# Patient Record
Sex: Male | Born: 1940 | Race: White | Hispanic: No | Smoking: Former smoker
Health system: Southern US, Community
[De-identification: ages and names within clinical notes are randomized; demographics above are authoritative.]

## PROBLEM LIST (undated history)

## (undated) DIAGNOSIS — M1990 Unspecified osteoarthritis, unspecified site: Secondary | ICD-10-CM

## (undated) DIAGNOSIS — E7800 Pure hypercholesterolemia, unspecified: Secondary | ICD-10-CM

## (undated) DIAGNOSIS — H9319 Tinnitus, unspecified ear: Secondary | ICD-10-CM

## (undated) HISTORY — PX: OTHER SURGICAL HISTORY: SHX169

## (undated) HISTORY — PX: TONSILLECTOMY: SUR1361

## (undated) HISTORY — DX: Unspecified osteoarthritis, unspecified site: M19.90

## (undated) HISTORY — DX: Pure hypercholesterolemia, unspecified: E78.00

---

## 1979-06-27 HISTORY — PX: HERNIA REPAIR: SHX51

## 1979-06-27 HISTORY — PX: OTHER SURGICAL HISTORY: SHX169

## 2010-02-05 ENCOUNTER — Encounter: Admission: RE | Admit: 2010-02-05 | Discharge: 2010-02-05 | Payer: Self-pay | Admitting: Cardiology

## 2010-08-26 HISTORY — PX: OTHER SURGICAL HISTORY: SHX169

## 2011-02-06 ENCOUNTER — Other Ambulatory Visit: Payer: Self-pay | Admitting: *Deleted

## 2011-02-06 DIAGNOSIS — Z79899 Other long term (current) drug therapy: Secondary | ICD-10-CM

## 2011-02-06 DIAGNOSIS — E7800 Pure hypercholesterolemia, unspecified: Secondary | ICD-10-CM

## 2011-02-09 ENCOUNTER — Other Ambulatory Visit (INDEPENDENT_AMBULATORY_CARE_PROVIDER_SITE_OTHER): Payer: 59 | Admitting: *Deleted

## 2011-02-09 DIAGNOSIS — E7800 Pure hypercholesterolemia, unspecified: Secondary | ICD-10-CM

## 2011-02-09 DIAGNOSIS — Z79899 Other long term (current) drug therapy: Secondary | ICD-10-CM

## 2011-02-09 LAB — BASIC METABOLIC PANEL
CO2: 28 mEq/L (ref 19–32)
Chloride: 100 mEq/L (ref 96–112)
GFR: 77.88 mL/min (ref >60.00)
Sodium: 135 mEq/L (ref 135–145)

## 2011-02-09 LAB — HEPATIC FUNCTION PANEL
ALT: 21 U/L (ref 0–53)
Albumin: 4.2 g/dL (ref 3.5–5.2)

## 2011-02-09 LAB — CBC WITH DIFFERENTIAL/PLATELET
Basophils Relative: 0.6 % (ref 0.0–3.0)
HCT: 46.9 % (ref 39.0–52.0)
MCV: 91.1 fl (ref 78.0–100.0)
Neutrophils Relative %: 59.8 % (ref 43.0–77.0)
WBC: 7 10*3/uL (ref 4.5–10.5)

## 2011-02-09 LAB — LIPID PANEL
Cholesterol: 136 mg/dL (ref 0–200)
HDL: 47 mg/dL (ref >39.00)
LDL Cholesterol: 74 mg/dL (ref 0–99)
Total CHOL/HDL Ratio: 3
VLDL: 15.2 mg/dL (ref 0.0–40.0)

## 2011-02-10 ENCOUNTER — Encounter: Payer: Self-pay | Admitting: Cardiology

## 2011-02-10 DIAGNOSIS — M1990 Unspecified osteoarthritis, unspecified site: Secondary | ICD-10-CM | POA: Insufficient documentation

## 2011-02-10 DIAGNOSIS — E7800 Pure hypercholesterolemia, unspecified: Secondary | ICD-10-CM | POA: Insufficient documentation

## 2011-02-11 ENCOUNTER — Ambulatory Visit (INDEPENDENT_AMBULATORY_CARE_PROVIDER_SITE_OTHER): Payer: 59 | Admitting: Cardiology

## 2011-02-11 ENCOUNTER — Ambulatory Visit
Admission: RE | Admit: 2011-02-11 | Discharge: 2011-02-11 | Disposition: A | Discharge status: Home / Self Care | Payer: 59 | Source: Ambulatory Visit | Attending: Cardiology | Admitting: Cardiology

## 2011-02-11 ENCOUNTER — Encounter: Payer: Self-pay | Admitting: Cardiology

## 2011-02-11 VITALS — BP 120/70 | HR 76 | Wt 196.0 lb

## 2011-02-11 DIAGNOSIS — M1990 Unspecified osteoarthritis, unspecified site: Secondary | ICD-10-CM

## 2011-02-11 DIAGNOSIS — E7800 Pure hypercholesterolemia, unspecified: Secondary | ICD-10-CM

## 2011-02-11 NOTE — Assessment & Plan Note (Signed)
This patient has had a history of hypercholesterolemia he previously was on Crestor and this was changed several years ago to simvastatin 40 mg generic.  He's not having any side effects from simvastatin.  He is watching his diet better and his weight is down 5 pounds.  He has not been expressing any chest pain or shortness of breath.

## 2011-02-11 NOTE — Assessment & Plan Note (Signed)
The patient does have some osteoarthritis.  Since we last saw him he underwent successful arthroscopic surgery on his right shoulder by Dr. Lajoyce Corners.  He had good postoperative physical therapy and has had a good functional outcome.  Patient is also been experiencing some back pain.  For now he had to stop his regular walking and stopped doing his abdominal crunches but cause of his back pain but now he is back to full activity again.  He has Aleve on hand for p.r.n. Use.

## 2011-02-11 NOTE — Progress Notes (Signed)
Matthew Collins Date of Birth:  December 27, 1940 [facility] [address]   [address] [place], [place]  [address] [phone]           Fax   [phone]  History of Present Illness:  this pleasant 70 year old gentleman is seen for one year comprehensive medical evaluation.  He has remained in good health.  He does have some arthritic problems in his back and previously in his right shoulder.  Since we last saw him he has had successful arthroscopic surgery on his right shoulder the patient is not having any exertional chest pain to suggest angina.  He's had no symptoms of congestive heart failure.  He's had no change in bowel habits and he did have a colonoscopy which was normal.  He has been allowed to go 10 years between colonoscopies.  He's had no dizziness or syncope.  Z. No respiratory complaints or cough or sputum production.  He does have nocturia x1.  He has had some erectile dysfunction in the past and at one point had tried some Cialis.  Current Outpatient Prescriptions  Medication Sig Dispense Refill  . ALPRAZolam (XANAX) 0.25 MG tablet Take 0.25 mg by mouth at bedtime as needed.        Marland Kitchen aspirin 81 MG tablet Take 81 mg by mouth daily.        . Naproxen Sodium (ALEVE PO) Take by mouth as needed.        . simvastatin (ZOCOR) 40 MG tablet Take 40 mg by mouth at bedtime.          No Known Allergies  Patient Active Problem List  Diagnoses  . Hypercholesterolemia  . Osteoarthritis    History  Smoking status  . Former Smoker  . Quit date: 02/10/1991  Smokeless tobacco  . Not on file    History  Alcohol Use No    Family History  Problem Relation Age of Onset  . Heart disease Mother 10  . Heart failure Mother 42  . Heart failure Father 64    Review of Systems: Constitutional: no fever chills diaphoresis or fatigue or change in weight.  Head and neck: no hearing loss, no epistaxis, no photophobia or visual disturbance. Respiratory: No  cough, shortness of breath or wheezing. Cardiovascular: No chest pain peripheral edema, palpitations. Gastrointestinal: No abdominal distention, no abdominal pain, no change in bowel habits hematochezia or melena. Genitourinary: No dysuria, no frequency, no urgency, no nocturia. Musculoskeletal:No arthralgias, no back pain, no gait disturbance or myalgias. Neurological: No dizziness, no headaches, no numbness, no seizures, no syncope, no weakness, no tremors. Hematologic: No lymphadenopathy, no easy bruising. Psychiatric: No confusion, no hallucinations, no sleep disturbance.    Physical Exam: Filed Vitals:   02/11/11 1420  BP: 120/70  Pulse: 76    Weight 196, down 5 pounds.  The general appearance reveals a well-developed well-nourished gentleman in no distress.Pupils equal and reactive.   Extraocular Movements are full.  There is no scleral icterus.  The mouth and pharynx are normal.  The neck is supple.  The carotids reveal no bruits.  The jugular venous pressure is normal.  The thyroid is not enlarged.  There is no lymphadenopathy.The chest is clear to percussion and auscultation. There are no rales or rhonchi. Expansion of the chest is symmetrical.The precordium is quiet.  The first heart sound is normal.  The second heart sound is physiologically split.  There is no murmur gallop rub or click.  There is no  abnormal lift or heave.The abdomen is soft and nontender. Bowel sounds are normal. The liver and spleen are not enlarged. There Are no abdominal masses. There are no bruits. Rectal exam reveals mildly enlarged prostate without hard nodules.  Stool is brown and benzidine negative .  No rectal mass.The pedal pulses are good.  There is no phlebitis or edema.  There is no cyanosis or clubbing.Strength is normal and symmetrical in all extremities.  There is no lateralizing weakness.  There are no sensory deficits.The skin is warm and dry.  There is no rash.   Assessment / Plan:   We reviewed  his labs which are excellent.  His electrocardiogram is normal, and a chest x-ray was ordered.  No change in regimen and may return in one year for followup comprehensive medical exam.

## 2011-02-12 ENCOUNTER — Telehealth: Payer: Self-pay | Admitting: *Deleted

## 2011-02-12 NOTE — Telephone Encounter (Signed)
Message copied by Regis Bill on Thu Feb 12, 2011 11:00 AM ------      Message from: Cassell Clement      Created: Wed Feb 11, 2011  9:46 PM       Pls report.  Xray nl.

## 2011-02-12 NOTE — Telephone Encounter (Signed)
Advised patient of chest xray 

## 2011-02-12 NOTE — Progress Notes (Signed)
Advise patient: 

## 2011-07-23 ENCOUNTER — Other Ambulatory Visit: Payer: Self-pay | Admitting: Cardiology

## 2011-07-23 ENCOUNTER — Other Ambulatory Visit: Payer: Self-pay | Admitting: *Deleted

## 2011-07-23 DIAGNOSIS — F419 Anxiety disorder, unspecified: Secondary | ICD-10-CM

## 2011-07-23 MED ORDER — ALPRAZOLAM 0.25 MG PO TABS: 0.2500 mg | ORAL_TABLET | Freq: Every evening | ORAL | Status: DC | PRN

## 2011-07-23 NOTE — Telephone Encounter (Signed)
Pt wants refill of xanax called to [facility]

## 2011-07-23 NOTE — Telephone Encounter (Signed)
Refilled xanax to [facility]

## 2011-08-27 HISTORY — PX: OTHER SURGICAL HISTORY: SHX169

## 2011-10-27 HISTORY — PX: OTHER SURGICAL HISTORY: SHX169

## 2011-12-17 ENCOUNTER — Other Ambulatory Visit: Payer: Self-pay | Admitting: *Deleted

## 2011-12-17 DIAGNOSIS — F419 Anxiety disorder, unspecified: Secondary | ICD-10-CM

## 2011-12-17 MED ORDER — ALPRAZOLAM 0.25 MG PO TABS: 0.2500 mg | ORAL_TABLET | Freq: Every evening | ORAL | Status: DC | PRN

## 2011-12-17 NOTE — Telephone Encounter (Signed)
Refill on alprazolam 

## 2012-01-29 ENCOUNTER — Other Ambulatory Visit (HOSPITAL_COMMUNITY): Payer: Self-pay | Admitting: Orthopaedic Surgery

## 2012-02-09 ENCOUNTER — Ambulatory Visit (INDEPENDENT_AMBULATORY_CARE_PROVIDER_SITE_OTHER): Payer: 59 | Admitting: *Deleted

## 2012-02-09 DIAGNOSIS — Z125 Encounter for screening for malignant neoplasm of prostate: Secondary | ICD-10-CM

## 2012-02-09 DIAGNOSIS — Z139 Encounter for screening, unspecified: Secondary | ICD-10-CM

## 2012-02-09 DIAGNOSIS — E7800 Pure hypercholesterolemia, unspecified: Secondary | ICD-10-CM

## 2012-02-09 LAB — LIPID PANEL
HDL: 48.1 mg/dL (ref >39.00)
LDL Cholesterol: 74 mg/dL (ref 0–99)
Total CHOL/HDL Ratio: 3
Triglycerides: 54 mg/dL (ref 0.0–149.0)

## 2012-02-09 LAB — BASIC METABOLIC PANEL
Calcium: 8.6 mg/dL (ref 8.4–10.5)
GFR: 71.88 mL/min (ref >60.00)
Glucose, Bld: 84 mg/dL (ref 70–99)
Sodium: 135 mEq/L (ref 135–145)

## 2012-02-09 LAB — HEPATIC FUNCTION PANEL
ALT: 22 U/L (ref 0–53)
Albumin: 4 g/dL (ref 3.5–5.2)
Alkaline Phosphatase: 48 U/L (ref 39–117)
Total Protein: 6.1 g/dL (ref 6.0–8.3)

## 2012-02-09 LAB — PSA: PSA: 0.72 ng/mL (ref 0.10–4.00)

## 2012-02-10 ENCOUNTER — Encounter: Payer: Self-pay | Admitting: *Deleted

## 2012-02-11 ENCOUNTER — Ambulatory Visit (INDEPENDENT_AMBULATORY_CARE_PROVIDER_SITE_OTHER): Payer: 59 | Admitting: Cardiology

## 2012-02-11 ENCOUNTER — Encounter: Payer: Self-pay | Admitting: Cardiology

## 2012-02-11 VITALS — BP 118/80 | HR 72 | Ht 73.0 in | Wt 183.0 lb

## 2012-02-11 DIAGNOSIS — E7800 Pure hypercholesterolemia, unspecified: Secondary | ICD-10-CM

## 2012-02-11 DIAGNOSIS — M1990 Unspecified osteoarthritis, unspecified site: Secondary | ICD-10-CM

## 2012-02-11 NOTE — Assessment & Plan Note (Signed)
The patient has not been able to walk effectively because of his left hip pain.  Also he had a long postoperative course after his foot surgery in November 2012.  Despite the lack of exercise his weight is actually down 13 pounds which she attributes to the stress of recovery from his foot surgery.

## 2012-02-11 NOTE — Patient Instructions (Signed)
Your physician recommends that you continue on your current medications as directed. Please refer to the Current Medication list given to you today.  Your physician wants you to follow-up in: 1 year. You will receive a reminder letter in the mail two months in advance. If you don't receive a letter, please call our office to schedule the follow-up appointment.  

## 2012-02-11 NOTE — Progress Notes (Signed)
Matthew Collins Date of Birth:  January 18, 1941 [facility] [address] [place], [place]  [address] [phone]         Fax   [phone]  History of Present Illness: This pleasant 71 year old gentleman is seen for a one-year followup office visit.  He has a history of hypercholesterolemia and is on the statin 40 mg daily.  He also has a past history of osteoarthritis.  He has had previous right shoulder surgery and right foot surgery.  In June he will having a left total hip replacement by Dr. Rayburn Ma.  Patient has a strong family history of ischemic heart disease.  He himself however has not been experiencing any chest pain or shortness of breath.  His blood pressure has been normal and he is not diabetic.  Current Outpatient Prescriptions  Medication Sig Dispense Refill  . ALPRAZolam (XANAX) 0.25 MG tablet Take 1 tablet (0.25 mg total) by mouth at bedtime as needed.  30 tablet  5  . aspirin 81 MG tablet Take 81 mg by mouth daily.        Marland Kitchen HYDROcodone-acetaminophen (NORCO) 5-325 MG per tablet Take 1 tablet by mouth as needed. Taking 1/2 as needed      . Naproxen Sodium (ALEVE PO) Take by mouth as needed.        . simvastatin (ZOCOR) 40 MG tablet Take 40 mg by mouth at bedtime.          No Known Allergies  Patient Active Problem List  Diagnoses  . Hypercholesterolemia  . Osteoarthritis    History  Smoking status  . Former Smoker  . Quit date: 02/10/1991  Smokeless tobacco  . Not on file    History  Alcohol Use No    Family History  Problem Relation Age of Onset  . Heart disease Mother 59  . Heart failure Mother 89  . Heart failure Father 35    Review of Systems: Constitutional: no fever chills diaphoresis or fatigue or change in weight.  Head and neck: no hearing loss, no epistaxis, no photophobia or visual disturbance. Respiratory: No cough, shortness of breath or wheezing. Cardiovascular: No chest pain peripheral edema,  palpitations. Gastrointestinal: No abdominal distention, no abdominal pain, no change in bowel habits hematochezia or melena. Genitourinary: No dysuria, no frequency, no urgency, no nocturia. Musculoskeletal:No arthralgias, no back pain, no gait disturbance or myalgias. Neurological: No dizziness, no headaches, no numbness, no seizures, no syncope, no weakness, no tremors. Hematologic: No lymphadenopathy, no easy bruising. Psychiatric: No confusion, no hallucinations, no sleep disturbance.    Physical Exam: Filed Vitals:   02/11/12 1100  BP: 118/80  Pulse: 72   the general appearance reveals a well-developed well-nourished gentleman in no distress.The head and neck exam reveals pupils equal and reactive.  Extraocular movements are full.  There is no scleral icterus.  The mouth and pharynx are normal.  The neck is supple.  The carotids reveal no bruits.  The jugular venous pressure is normal.  The  thyroid is not enlarged.  There is no lymphadenopathy.  The chest is clear to percussion and auscultation.  There are no rales or rhonchi.  Expansion of the chest is symmetrical.  The precordium is quiet.  The first heart sound is normal.  The second heart sound is physiologically split.  There is no murmur gallop rub or click.  There is no abnormal lift or heave.  The abdomen is soft and nontender.  The bowel sounds are normal.  The  liver and spleen are not enlarged.  There are no abdominal masses.  There are no abdominal bruits.  Extremities reveal good pedal pulses.  There is no phlebitis or edema.  There is no cyanosis or clubbing.  Strength is normal and symmetrical in all extremities.  There is no lateralizing weakness.  There are no sensory deficits.  The skin is warm and dry.  There is no rash.  EKG today shows normal sinus rhythm and is within normal limits   Assessment / Plan: The patient continues to do well.  He will continue on his same dose of simvastatin for control of  hypercholesterolemia.  No new medications are prescribed today.  From a cardiovascular standpoint the patient is stable for proposed hip surgery in June.  The patient will be seeing Dr. Creola Corn for ongoing medical care and we will plan to see the patient in one year for followup office visit, EKG and fasting lipid panel and chemistries.

## 2012-02-11 NOTE — Assessment & Plan Note (Signed)
The patient continues to do well from a cardiovascular standpoint.  No evidence of any atherosclerotic complications at this point.  We reviewed his recent lab work which is satisfactory.  Is not having any side effects from the simvastatin.

## 2012-02-18 ENCOUNTER — Other Ambulatory Visit: Payer: Self-pay | Admitting: *Deleted

## 2012-02-18 MED ORDER — SIMVASTATIN 40 MG PO TABS: 40.0000 mg | ORAL_TABLET | Freq: Every day | ORAL | Status: DC

## 2012-02-18 NOTE — Telephone Encounter (Signed)
Refilled simvastatin.

## 2012-03-25 ENCOUNTER — Encounter (HOSPITAL_COMMUNITY): Payer: Self-pay | Admitting: Pharmacy Technician

## 2012-03-29 ENCOUNTER — Encounter (HOSPITAL_COMMUNITY)
Admission: RE | Admit: 2012-03-29 | Discharge: 2012-03-29 | Disposition: A | Discharge status: Home / Self Care | Payer: Medicare Other | Source: Ambulatory Visit | Attending: Orthopaedic Surgery | Admitting: Orthopaedic Surgery

## 2012-03-29 ENCOUNTER — Encounter (HOSPITAL_COMMUNITY): Payer: Self-pay

## 2012-03-29 HISTORY — DX: Tinnitus, unspecified ear: H93.19

## 2012-03-29 LAB — BASIC METABOLIC PANEL
BUN: 19 mg/dL (ref 6–23)
Calcium: 9 mg/dL (ref 8.4–10.5)
GFR calc Af Amer: 85 mL/min — ABNORMAL LOW (ref >90)
GFR calc non Af Amer: 73 mL/min — ABNORMAL LOW (ref >90)
Glucose, Bld: 90 mg/dL (ref 70–99)

## 2012-03-29 LAB — URINALYSIS, ROUTINE W REFLEX MICROSCOPIC
Ketones, ur: NEGATIVE mg/dL
Leukocytes, UA: NEGATIVE
Nitrite: NEGATIVE
Protein, ur: NEGATIVE mg/dL
Urobilinogen, UA: 0.2 mg/dL (ref 0.0–1.0)

## 2012-03-29 LAB — CBC
MCH: 31.4 pg (ref 26.0–34.0)
MCHC: 35 g/dL (ref 30.0–36.0)
Platelets: 243 10*3/uL (ref 150–400)
RDW: 12.2 % (ref 11.5–15.5)

## 2012-03-29 NOTE — Pre-Procedure Instructions (Signed)
Last office visit note dr Patty Sermons 02-11-2012 epic ekg 02-11-2012 epic

## 2012-03-29 NOTE — Patient Instructions (Addendum)
20 Matthew Collins    Your procedure is scheduled on:  04-01-2012  Report to [facility] at  0530 AM.  Call this number if you have problems the morning of surgery: [phone]   Remember:   Do not eat food or drink liquids:After Midnight.  .  Take these medicines the morning of surgery with A SIP OF WATER: eyedrop, hydrocodone if needed   Do not wear jewelry or make up.  Do not wear lotions, powders, or perfumes.Do not wear deodorant.    Do not bring valuables to the hospital.  Contacts, dentures or bridgework may not be worn into surgery.  Leave suitcase in the car. After surgery it may be brought to your room.  For patients admitted to the hospital, checkout time is 11:00 AM the day of discharge.     Special Instructions: CHG Shower Use Special Wash: 1/2 bottle night before surgery and 1/2 bottle morning of surgery.neck down avoid private area   Please read over the following fact sheets that you were given: MRSA Information, blood fact sheet  Cain Sieve [facility] pre op nurse phone number [phone], call if needed

## 2012-03-31 NOTE — Anesthesia Preprocedure Evaluation (Addendum)
Anesthesia Evaluation  Patient identified by MRN, date of birth, ID band Patient awake    Reviewed: Allergy & Precautions, H&P , NPO status , Patient's Chart, lab work & pertinent test results  Airway Mallampati: II TM Distance: >3 FB Neck ROM: full    Dental No notable dental hx. (+) Teeth Intact and Dental Advisory Given Implant left upper front lateral incissor:   Pulmonary neg pulmonary ROS,  breath sounds clear to auscultation  Pulmonary exam normal       Cardiovascular Exercise Tolerance: Good negative cardio ROS  Rhythm:regular Rate:Normal     Neuro/Psych negative neurological ROS  negative psych ROS   GI/Hepatic negative GI ROS, Neg liver ROS,   Endo/Other  negative endocrine ROS  Renal/GU negative Renal ROS  negative genitourinary   Musculoskeletal   Abdominal   Peds  Hematology negative hematology ROS (+)   Anesthesia Other Findings   Reproductive/Obstetrics negative OB ROS                          Anesthesia Physical Anesthesia Plan  ASA: I  Anesthesia Plan: General   Post-op Pain Management:    Induction: Intravenous  Airway Management Planned: Oral ETT  Additional Equipment:   Intra-op Plan:   Post-operative Plan: Extubation in OR  Informed Consent: I have reviewed the patients History and Physical, chart, labs and discussed the procedure including the risks, benefits and alternatives for the proposed anesthesia with the patient or authorized representative who has indicated his/her understanding and acceptance.   Dental Advisory Given  Plan Discussed with: CRNA and Surgeon  Anesthesia Plan Comments:         Anesthesia Quick Evaluation

## 2012-04-01 ENCOUNTER — Encounter (HOSPITAL_COMMUNITY): Payer: Self-pay | Admitting: Anesthesiology

## 2012-04-01 ENCOUNTER — Ambulatory Visit (HOSPITAL_COMMUNITY): Payer: Medicare Other

## 2012-04-01 ENCOUNTER — Encounter (HOSPITAL_COMMUNITY): Payer: Self-pay | Admitting: *Deleted

## 2012-04-01 ENCOUNTER — Ambulatory Visit (HOSPITAL_COMMUNITY): Payer: Medicare Other | Admitting: Anesthesiology

## 2012-04-01 ENCOUNTER — Encounter (HOSPITAL_COMMUNITY)
Admission: RE | Disposition: A | Discharge status: Home Health | Payer: Self-pay | Source: Ambulatory Visit | Attending: Orthopaedic Surgery

## 2012-04-01 ENCOUNTER — Inpatient Hospital Stay (HOSPITAL_COMMUNITY)
Admission: RE | Admit: 2012-04-01 | Discharge: 2012-04-03 | DRG: 470 | Disposition: A | Discharge status: Home Health | Payer: Medicare Other | Source: Ambulatory Visit | Attending: Orthopaedic Surgery | Admitting: Orthopaedic Surgery

## 2012-04-01 DIAGNOSIS — M1610 Unilateral primary osteoarthritis, unspecified hip: Principal | ICD-10-CM | POA: Diagnosis present

## 2012-04-01 DIAGNOSIS — Z01812 Encounter for preprocedural laboratory examination: Secondary | ICD-10-CM

## 2012-04-01 DIAGNOSIS — M169 Osteoarthritis of hip, unspecified: Secondary | ICD-10-CM

## 2012-04-01 HISTORY — PX: TOTAL HIP ARTHROPLASTY: SHX124

## 2012-04-01 LAB — ABO/RH: ABO/RH(D): A POS

## 2012-04-01 SURGERY — ARTHROPLASTY, HIP, TOTAL, ANTERIOR APPROACH
Anesthesia: General | Site: Hip | Laterality: Left | Wound class: Clean

## 2012-04-01 MED ORDER — ACETAMINOPHEN 10 MG/ML IV SOLN
INTRAVENOUS | Status: DC | PRN
  Administered 2012-04-01: 1000 mg via INTRAVENOUS

## 2012-04-01 MED ORDER — DIPHENHYDRAMINE HCL 12.5 MG/5ML PO ELIX
12.5000 mg | ORAL_SOLUTION | Freq: Four times a day (QID) | ORAL | Status: DC | PRN
  Filled 2012-04-01: qty 5

## 2012-04-01 MED ORDER — DOCUSATE SODIUM 100 MG PO CAPS
100.0000 mg | ORAL_CAPSULE | Freq: Two times a day (BID) | ORAL | Status: DC
  Administered 2012-04-01 – 2012-04-03 (×5): 100 mg via ORAL

## 2012-04-01 MED ORDER — COUMADIN BOOK
Freq: Once | Status: AC
  Administered 2012-04-01: 18:00:00
  Filled 2012-04-01: qty 1

## 2012-04-01 MED ORDER — WARFARIN - PHARMACIST DOSING INPATIENT: Freq: Every day | Status: DC

## 2012-04-01 MED ORDER — HYDROMORPHONE HCL PF 1 MG/ML IJ SOLN
INTRAMUSCULAR | Status: AC
  Filled 2012-04-01: qty 1

## 2012-04-01 MED ORDER — DEXAMETHASONE SODIUM PHOSPHATE 10 MG/ML IJ SOLN
INTRAMUSCULAR | Status: DC | PRN
  Administered 2012-04-01: 10 mg via INTRAVENOUS

## 2012-04-01 MED ORDER — MENTHOL 3 MG MT LOZG
1.0000 | LOZENGE | OROMUCOSAL | Status: DC | PRN
  Filled 2012-04-01 (×2): qty 9

## 2012-04-01 MED ORDER — SODIUM CHLORIDE 0.9 % IJ SOLN: 9.0000 mL | INTRAMUSCULAR | Status: DC | PRN

## 2012-04-01 MED ORDER — ONDANSETRON HCL 4 MG/2ML IJ SOLN: 4.0000 mg | Freq: Four times a day (QID) | INTRAMUSCULAR | Status: DC | PRN

## 2012-04-01 MED ORDER — METHOCARBAMOL 100 MG/ML IJ SOLN
500.0000 mg | Freq: Four times a day (QID) | INTRAVENOUS | Status: DC | PRN
  Administered 2012-04-01 – 2012-04-02 (×3): 500 mg via INTRAVENOUS
  Filled 2012-04-01 (×4): qty 5

## 2012-04-01 MED ORDER — LACTATED RINGERS IV SOLN
INTRAVENOUS | Status: DC | PRN
  Administered 2012-04-01 (×2): via INTRAVENOUS

## 2012-04-01 MED ORDER — WARFARIN VIDEO
Freq: Once | Status: AC
  Administered 2012-04-02: 12:00:00

## 2012-04-01 MED ORDER — ACETAMINOPHEN 650 MG RE SUPP: 650.0000 mg | Freq: Four times a day (QID) | RECTAL | Status: DC | PRN

## 2012-04-01 MED ORDER — ONDANSETRON HCL 4 MG PO TABS: 4.0000 mg | ORAL_TABLET | Freq: Four times a day (QID) | ORAL | Status: DC | PRN

## 2012-04-01 MED ORDER — PROPOFOL 10 MG/ML IV BOLUS
INTRAVENOUS | Status: DC | PRN
  Administered 2012-04-01: 150 mg via INTRAVENOUS

## 2012-04-01 MED ORDER — LACTATED RINGERS IV SOLN: INTRAVENOUS | Status: DC

## 2012-04-01 MED ORDER — CEFAZOLIN SODIUM-DEXTROSE 2-3 GM-% IV SOLR
2.0000 g | INTRAVENOUS | Status: AC
  Administered 2012-04-01: 2 g via INTRAVENOUS

## 2012-04-01 MED ORDER — SIMVASTATIN 40 MG PO TABS
40.0000 mg | ORAL_TABLET | Freq: Every day | ORAL | Status: DC
  Administered 2012-04-01 – 2012-04-02 (×2): 40 mg via ORAL
  Filled 2012-04-01 (×3): qty 1

## 2012-04-01 MED ORDER — ALPRAZOLAM 0.25 MG PO TABS
0.2500 mg | ORAL_TABLET | Freq: Every evening | ORAL | Status: DC | PRN
  Administered 2012-04-02 (×2): 0.25 mg via ORAL
  Filled 2012-04-01 (×2): qty 1

## 2012-04-01 MED ORDER — DIPHENHYDRAMINE HCL 50 MG/ML IJ SOLN: 12.5000 mg | Freq: Four times a day (QID) | INTRAMUSCULAR | Status: DC | PRN

## 2012-04-01 MED ORDER — ZOLPIDEM TARTRATE 5 MG PO TABS: 5.0000 mg | ORAL_TABLET | Freq: Every evening | ORAL | Status: DC | PRN

## 2012-04-01 MED ORDER — CEFAZOLIN SODIUM 1-5 GM-% IV SOLN
1.0000 g | Freq: Four times a day (QID) | INTRAVENOUS | Status: AC
  Administered 2012-04-01 – 2012-04-02 (×3): 1 g via INTRAVENOUS
  Filled 2012-04-01 (×3): qty 50

## 2012-04-01 MED ORDER — METOCLOPRAMIDE HCL 5 MG/ML IJ SOLN: 5.0000 mg | Freq: Three times a day (TID) | INTRAMUSCULAR | Status: DC | PRN

## 2012-04-01 MED ORDER — NEOSTIGMINE METHYLSULFATE 1 MG/ML IJ SOLN
INTRAMUSCULAR | Status: DC | PRN
  Administered 2012-04-01: 4 mg via INTRAVENOUS

## 2012-04-01 MED ORDER — NALOXONE HCL 0.4 MG/ML IJ SOLN: 0.4000 mg | INTRAMUSCULAR | Status: DC | PRN

## 2012-04-01 MED ORDER — ACETAMINOPHEN 10 MG/ML IV SOLN
INTRAVENOUS | Status: AC
  Filled 2012-04-01: qty 100

## 2012-04-01 MED ORDER — FENTANYL CITRATE 0.05 MG/ML IJ SOLN
INTRAMUSCULAR | Status: DC | PRN
  Administered 2012-04-01 (×2): 50 ug via INTRAVENOUS
  Administered 2012-04-01 (×2): 100 ug via INTRAVENOUS
  Administered 2012-04-01 (×4): 50 ug via INTRAVENOUS

## 2012-04-01 MED ORDER — 0.9 % SODIUM CHLORIDE (POUR BTL) OPTIME
TOPICAL | Status: DC | PRN
  Administered 2012-04-01: 1000 mL

## 2012-04-01 MED ORDER — SODIUM CHLORIDE 0.9 % IV SOLN
INTRAVENOUS | Status: DC
  Administered 2012-04-01 (×2): via INTRAVENOUS

## 2012-04-01 MED ORDER — ADULT MULTIVITAMIN W/MINERALS CH
1.0000 | ORAL_TABLET | Freq: Every day | ORAL | Status: DC
  Administered 2012-04-01 – 2012-04-03 (×3): 1 via ORAL
  Filled 2012-04-01 (×3): qty 1

## 2012-04-01 MED ORDER — CEFAZOLIN SODIUM-DEXTROSE 2-3 GM-% IV SOLR
INTRAVENOUS | Status: AC
  Filled 2012-04-01: qty 50

## 2012-04-01 MED ORDER — MORPHINE SULFATE (PF) 1 MG/ML IV SOLN
INTRAVENOUS | Status: DC
  Administered 2012-04-01: 5.39 mg via INTRAVENOUS
  Administered 2012-04-01: 6 mg via INTRAVENOUS
  Administered 2012-04-01: 10:00:00 via INTRAVENOUS
  Administered 2012-04-01: 9.4 mg via INTRAVENOUS
  Administered 2012-04-02 (×2): 4.5 mg via INTRAVENOUS
  Administered 2012-04-02: 9 mg via INTRAVENOUS
  Administered 2012-04-02: 7.5 mg via INTRAVENOUS
  Filled 2012-04-01 (×2): qty 25

## 2012-04-01 MED ORDER — MIDAZOLAM HCL 5 MG/5ML IJ SOLN
INTRAMUSCULAR | Status: DC | PRN
  Administered 2012-04-01: 2 mg via INTRAVENOUS

## 2012-04-01 MED ORDER — DIPHENHYDRAMINE HCL 12.5 MG/5ML PO ELIX: 12.5000 mg | ORAL_SOLUTION | ORAL | Status: DC | PRN

## 2012-04-01 MED ORDER — HYDROMORPHONE HCL PF 1 MG/ML IJ SOLN
0.2500 mg | INTRAMUSCULAR | Status: DC | PRN
  Administered 2012-04-01 (×4): 0.5 mg via INTRAVENOUS

## 2012-04-01 MED ORDER — ACETAMINOPHEN 325 MG PO TABS: 650.0000 mg | ORAL_TABLET | Freq: Four times a day (QID) | ORAL | Status: DC | PRN

## 2012-04-01 MED ORDER — ALUM & MAG HYDROXIDE-SIMETH 200-200-20 MG/5ML PO SUSP
30.0000 mL | ORAL | Status: DC | PRN
  Administered 2012-04-02 (×2): 30 mL via ORAL
  Filled 2012-04-01 (×2): qty 30

## 2012-04-01 MED ORDER — WARFARIN SODIUM 5 MG PO TABS
5.0000 mg | ORAL_TABLET | Freq: Once | ORAL | Status: DC
  Filled 2012-04-01: qty 1

## 2012-04-01 MED ORDER — METHOCARBAMOL 500 MG PO TABS
500.0000 mg | ORAL_TABLET | Freq: Four times a day (QID) | ORAL | Status: DC | PRN
  Filled 2012-04-01: qty 1

## 2012-04-01 MED ORDER — HYDROCODONE-ACETAMINOPHEN 5-325 MG PO TABS
1.0000 | ORAL_TABLET | ORAL | Status: DC | PRN
  Administered 2012-04-02 – 2012-04-03 (×2): 2 via ORAL
  Filled 2012-04-01 (×2): qty 2

## 2012-04-01 MED ORDER — ONDANSETRON HCL 4 MG/2ML IJ SOLN
INTRAMUSCULAR | Status: DC | PRN
  Administered 2012-04-01: 4 mg via INTRAVENOUS

## 2012-04-01 MED ORDER — MORPHINE SULFATE (PF) 1 MG/ML IV SOLN
INTRAVENOUS | Status: AC
  Administered 2012-04-01: 4.5 mg via INTRAVENOUS
  Filled 2012-04-01: qty 25

## 2012-04-01 MED ORDER — GLYCOPYRROLATE 0.2 MG/ML IJ SOLN
INTRAMUSCULAR | Status: DC | PRN
  Administered 2012-04-01: .5 mg via INTRAVENOUS

## 2012-04-01 MED ORDER — WARFARIN SODIUM 7.5 MG PO TABS
7.5000 mg | ORAL_TABLET | Freq: Once | ORAL | Status: AC
  Administered 2012-04-01: 7.5 mg via ORAL
  Filled 2012-04-01: qty 1

## 2012-04-01 MED ORDER — PHENOL 1.4 % MT LIQD
1.0000 | OROMUCOSAL | Status: DC | PRN
  Filled 2012-04-01: qty 177

## 2012-04-01 MED ORDER — METOCLOPRAMIDE HCL 10 MG PO TABS: 5.0000 mg | ORAL_TABLET | Freq: Three times a day (TID) | ORAL | Status: DC | PRN

## 2012-04-01 MED ORDER — ROCURONIUM BROMIDE 100 MG/10ML IV SOLN
INTRAVENOUS | Status: DC | PRN
  Administered 2012-04-01: 40 mg via INTRAVENOUS

## 2012-04-01 MED ORDER — MORPHINE SULFATE 2 MG/ML IJ SOLN: 1.0000 mg | INTRAMUSCULAR | Status: DC | PRN

## 2012-04-01 MED ORDER — OXYCODONE HCL 5 MG PO TABS: 5.0000 mg | ORAL_TABLET | ORAL | Status: DC | PRN

## 2012-04-01 SURGICAL SUPPLY — 36 items
BAG ZIPLOCK 12X15 (MISCELLANEOUS) ×4 IMPLANT
BLADE SAW SGTL 18X1.27X75 (BLADE) ×2 IMPLANT
CELLS DAT CNTRL 66122 CELL SVR (MISCELLANEOUS) ×1 IMPLANT
CLOTH BEACON ORANGE TIMEOUT ST (SAFETY) ×2 IMPLANT
DRAPE C-ARM 42X72 X-RAY (DRAPES) ×2 IMPLANT
DRAPE STERI IOBAN 125X83 (DRAPES) ×2 IMPLANT
DRAPE U-SHAPE 47X51 STRL (DRAPES) ×6 IMPLANT
DRSG MEPILEX BORDER 4X8 (GAUZE/BANDAGES/DRESSINGS) ×2 IMPLANT
DURAPREP 26ML APPLICATOR (WOUND CARE) ×2 IMPLANT
ELECT BLADE TIP CTD 4 INCH (ELECTRODE) ×2 IMPLANT
ELECT REM PT RETURN 9FT ADLT (ELECTROSURGICAL) ×2
ELECTRODE REM PT RTRN 9FT ADLT (ELECTROSURGICAL) ×1 IMPLANT
FACESHIELD LNG OPTICON STERILE (SAFETY) ×8 IMPLANT
GAUZE XEROFORM 1X8 LF (GAUZE/BANDAGES/DRESSINGS) ×2 IMPLANT
GLOVE BIO SURGEON STRL SZ7 (GLOVE) ×2 IMPLANT
GLOVE BIO SURGEON STRL SZ7.5 (GLOVE) ×2 IMPLANT
GLOVE BIOGEL PI IND STRL 7.5 (GLOVE) IMPLANT
GLOVE BIOGEL PI IND STRL 8 (GLOVE) ×1 IMPLANT
GLOVE BIOGEL PI INDICATOR 7.5 (GLOVE)
GLOVE BIOGEL PI INDICATOR 8 (GLOVE) ×1
GLOVE ECLIPSE 7.0 STRL STRAW (GLOVE) ×2 IMPLANT
GOWN STRL REIN XL XLG (GOWN DISPOSABLE) ×4 IMPLANT
KIT BASIN OR (CUSTOM PROCEDURE TRAY) ×2 IMPLANT
LABEL STERILE EO BLANK 1X3 WHT (LABEL) ×2 IMPLANT
PACK TOTAL JOINT (CUSTOM PROCEDURE TRAY) ×2 IMPLANT
PADDING CAST COTTON 6X4 STRL (CAST SUPPLIES) ×2 IMPLANT
RTRCTR WOUND ALEXIS 18CM MED (MISCELLANEOUS) ×2
STAPLER VISISTAT 35W (STAPLE) IMPLANT
SUT ETHIBOND NAB CT1 #1 30IN (SUTURE) ×4 IMPLANT
SUT VIC AB 1 CT1 36 (SUTURE) ×4 IMPLANT
SUT VIC AB 2-0 CT1 27 (SUTURE) ×2
SUT VIC AB 2-0 CT1 TAPERPNT 27 (SUTURE) ×2 IMPLANT
SUT VLOC 180 0 24IN GS25 (SUTURE) ×2 IMPLANT
TOWEL OR 17X26 10 PK STRL BLUE (TOWEL DISPOSABLE) ×4 IMPLANT
TOWEL OR NON WOVEN STRL DISP B (DISPOSABLE) ×2 IMPLANT
TRAY FOLEY CATH 14FRSI W/METER (CATHETERS) ×2 IMPLANT

## 2012-04-01 NOTE — Progress Notes (Signed)
UR COMPLETED  

## 2012-04-01 NOTE — Anesthesia Postprocedure Evaluation (Signed)
  Anesthesia Post-op Note  Patient: Matthew Collins  Procedure(s) Performed: Procedure(s) (LRB): TOTAL HIP ARTHROPLASTY ANTERIOR APPROACH (Left)  Patient Location: PACU  Anesthesia Type: General  Level of Consciousness: awake and alert   Airway and Oxygen Therapy: Patient Spontanous Breathing  Post-op Pain: mild  Post-op Assessment: Post-op Vital signs reviewed, Patient's Cardiovascular Status Stable, Respiratory Function Stable, Patent Airway and No signs of Nausea or vomiting  Post-op Vital Signs: stable  Complications: No apparent anesthesia complications

## 2012-04-01 NOTE — Brief Op Note (Signed)
04/01/2012  9:10 AM  PATIENT:  [name]  71 y.o. male  PRE-OPERATIVE DIAGNOSIS:  Left hip severe osteoarthritis  POST-OPERATIVE DIAGNOSIS:  Left hip severe osteoarthritis  PROCEDURE:  Procedure(s) (LRB): TOTAL HIP ARTHROPLASTY ANTERIOR APPROACH (Left)  SURGEON:  Surgeon(s) and Role:    * Kathryne Hitch, MD - Primary  PHYSICIAN ASSISTANT:   ASSISTANTS: none   ANESTHESIA:   general  EBL:  Total I/O In: 1900 [I.V.:1900] Out: 800 [Urine:300; Blood:500]  BLOOD ADMINISTERED:none  DRAINS: none   LOCAL MEDICATIONS USED:  NONE  SPECIMEN:  No Specimen  DISPOSITION OF SPECIMEN:  N/A  COUNTS:  YES  TOURNIQUET:  * No tourniquets in log *  DICTATION: .Other Dictation: Dictation Number [id number]  PLAN OF CARE: Admit to inpatient   PATIENT DISPOSITION:  PACU - hemodynamically stable.   Delay start of Pharmacological VTE agent (>24hrs) due to surgical blood loss or risk of bleeding: no

## 2012-04-01 NOTE — Progress Notes (Signed)
X-RAY RESULTS NOTED. 

## 2012-04-01 NOTE — H&P (Signed)
Matthew Collins is an 71 y.o. male.   Chief Complaint:   Severe left hip pain; known end-stage OA HPI:   71 yo active male with severe left hip pain and x-rays showing bone-on-bone wear.  He wishes to proceed with a left total hip replacement given his pain, decreased mobility, and the impact on his quality of life.  He understands the risks of blood loos, fracture, infection, DVT, and PE.  The goals are decreased pain and improved mobility.  Past Medical History  Diagnosis Date  . Hypercholesterolemia   . Osteoarthritis   . Tinnitus     both ears    Past Surgical History  Procedure Date  . Pre skin cancer removed from left leg jan 2013  . Right foot surgery nov 2012  . Rigth shoulder surgery for bune spurs nov 2011  . Hernia repair 1980's  . Tonsillectomy age 64  . Wisdom teeth extarction 25-30 yrs ago  . Periodontal gum surgery 1980's    Family History  Problem Relation Age of Onset  . Heart disease Mother 5  . Heart failure Mother 58  . Heart failure Father 45   Social History:  reports that he quit smoking about 38 years ago. His smoking use included Cigarettes. He quit after 10 years of use. He has quit using smokeless tobacco. He reports that he drinks alcohol. He reports that he does not use illicit drugs.  Allergies: No Known Allergies  Medications Prior to Admission  Medication Sig Dispense Refill  . ALPRAZolam (XANAX) 0.25 MG tablet Take 0.25 mg by mouth at bedtime as needed. sleep      . HYDROcodone-acetaminophen (NORCO) 5-325 MG per tablet Take 0.5-1 tablets by mouth every 6 (six) hours as needed. Pain       . simvastatin (ZOCOR) 40 MG tablet Take 1 tablet (40 mg total) by mouth at bedtime.  90 tablet  3  . tetrahydrozoline 0.05 % ophthalmic solution Place 1 drop into both eyes 2 (two) times daily as needed. Dry eyes      . Multiple Vitamin (MULITIVITAMIN WITH MINERALS) TABS Take 1 tablet by mouth daily.      . Naproxen Sodium (ALEVE PO) Take 440 mg by mouth 2  (two) times daily as needed. Pain       . OVER THE COUNTER MEDICATION ISOTONIC OPC 3 POWDER Q DAY      . tolnaftate (TINACTIN) 1 % cream Apply 1 application topically 2 (two) times daily as needed. Itching        Results for orders placed during the hospital encounter of 04/01/12 (from the past 48 hour(s))  TYPE AND SCREEN     Status: Normal (Preliminary result)   Collection Time   04/01/12  6:06 AM      Component Value Range Comment   ABO/RH(D) A POS      Antibody Screen PENDING      Sample Expiration 04/04/2012      No results found.  Review of Systems  All other systems reviewed and are negative.    Blood pressure 131/84, pulse 72, temperature 98 F (36.7 C), temperature source Oral, resp. rate 20, SpO2 100.00%. Physical Exam  Constitutional: He is oriented to person, place, and time. He appears well-developed and well-nourished.  HENT:  Head: Normocephalic and atraumatic.  Eyes: EOM are normal. Pupils are equal, round, and reactive to light.  Neck: Normal range of motion. Neck supple.  Cardiovascular: Normal rate and regular rhythm.   Respiratory: Effort normal  and breath sounds normal.  GI: Soft. Bowel sounds are normal.  Musculoskeletal:       Left hip: He exhibits decreased strength and bony tenderness.  Neurological: He is alert and oriented to person, place, and time.  Skin: Skin is warm and dry.  Psychiatric: He has a normal mood and affect.     Assessment/Plan To the OR today for a left total hip arthroplasty to treat his severe arthritis.  Matthew Collins Y 04/01/2012, 7:12 AM

## 2012-04-01 NOTE — Preoperative (Signed)
Beta Blockers   Reason not to administer Beta Blockers:Not Applicable 

## 2012-04-01 NOTE — Progress Notes (Signed)
Portable AP PELVIS  And LATERAL LEFT HIP X-RAYS DONE

## 2012-04-01 NOTE — Transfer of Care (Signed)
Immediate Anesthesia Transfer of Care Note  Patient: Matthew Collins  Procedure(s) Performed: Procedure(s) (LRB): TOTAL HIP ARTHROPLASTY ANTERIOR APPROACH (Left)  Patient Location: PACU  Anesthesia Type: General  Level of Consciousness: awake, alert , oriented and patient cooperative  Airway & Oxygen Therapy: Patient Spontanous Breathing and Patient connected to face mask oxygen  Post-op Assessment: Report given to PACU RN and Post -op Vital signs reviewed and stable  Post vital signs: Reviewed and stable  Complications: No apparent anesthesia complications

## 2012-04-01 NOTE — Plan of Care (Signed)
Problem: Consults Goal: Diagnosis- Total Joint Replacement Primary Total Hip     

## 2012-04-01 NOTE — Progress Notes (Signed)
ANTICOAGULATION CONSULT NOTE - Initial Consult  Pharmacy Consult for Warfarin Indication: VTE Prophylaxis following L THA  No Known Allergies  Patient Measurements: Height: 6' 1.5" (186.7 cm) Weight: 189 lb (85.73 kg) IBW/kg (Calculated) : 81.05    Vital Signs: Temp: 98 F (36.7 C) (06/07 1100) Temp src: Oral (06/07 0556) BP: 131/78 mmHg (06/07 1030) Pulse Rate: 78  (06/07 1100)  Labs:  Basename 03/29/12 1530  HGB 14.8  HCT 42.3  PLT 243  APTT 34  LABPROT 14.0  INR 1.06  HEPARINUNFRC --  CREATININE 1.02  CKTOTAL --  CKMB --  TROPONINI --    Estimated Creatinine Clearance: 78.4 ml/min (by C-G formula based on Cr of 1.02).   Medical History: Past Medical History  Diagnosis Date  . Hypercholesterolemia   . Osteoarthritis   . Tinnitus     both ears    Medications:  Scheduled:    .  ceFAZolin (ANCEF) IV  1 g Intravenous Q6H  .  ceFAZolin (ANCEF) IV  2 g Intravenous 60 min Pre-Op  . docusate sodium  100 mg Oral BID  . HYDROmorphone      . HYDROmorphone      . morphine   Intravenous Q4H  . morphine      . multivitamin with minerals  1 tablet Oral Daily  . simvastatin  40 mg Oral QHS   Infusions:    . sodium chloride 75 mL/hr at 04/01/12 1048  . DISCONTD: lactated ringers     PRN: acetaminophen, acetaminophen, ALPRAZolam, alum & mag hydroxide-simeth, diphenhydrAMINE, diphenhydrAMINE, diphenhydrAMINE, diphenhydrAMINE, diphenhydrAMINE, HYDROcodone-acetaminophen, menthol-cetylpyridinium, methocarbamol (ROBAXIN) IV, methocarbamol, metoCLOPramide (REGLAN) injection, metoCLOPramide, morphine injection, naloxone, naloxone, ondansetron (ZOFRAN) IV, ondansetron (ZOFRAN) IV, ondansetron (ZOFRAN) IV, ondansetron, oxyCODONE, phenol sodium chloride, sodium chloride, zolpidem, DISCONTD: 0.9 % irrigation (POUR BTL), DISCONTD:  HYDROmorphone (DILAUDID) injection  Assessment:  71 y/o M s/p L THA (anterior approach) 6/7, to begin warfarin tonight.   Goal of Therapy:    INR 2-3    Plan:  1. Warfarin 7.5mg  PO x 1 tonight 2. PT/INR daily  3. Warfarin teaching  (booklet and video have been requested).   Hope Budds, PharmD, BCPS Pager: [phone] 04/01/2012,11:25 AM

## 2012-04-02 LAB — CBC
HCT: 36.4 % — ABNORMAL LOW (ref 39.0–52.0)
Hemoglobin: 12.6 g/dL — ABNORMAL LOW (ref 13.0–17.0)
MCH: 31.2 pg (ref 26.0–34.0)
MCHC: 34.6 g/dL (ref 30.0–36.0)
MCV: 90.1 fL (ref 78.0–100.0)
RBC: 4.04 MIL/uL — ABNORMAL LOW (ref 4.22–5.81)

## 2012-04-02 LAB — BASIC METABOLIC PANEL
BUN: 14 mg/dL (ref 6–23)
CO2: 27 mEq/L (ref 19–32)
Calcium: 8.3 mg/dL — ABNORMAL LOW (ref 8.4–10.5)
Creatinine, Ser: 0.87 mg/dL (ref 0.50–1.35)
GFR calc non Af Amer: 86 mL/min — ABNORMAL LOW (ref >90)
Glucose, Bld: 111 mg/dL — ABNORMAL HIGH (ref 70–99)

## 2012-04-02 LAB — PROTIME-INR: INR: 1.1 (ref 0.00–1.49)

## 2012-04-02 MED ORDER — WARFARIN SODIUM 7.5 MG PO TABS
7.5000 mg | ORAL_TABLET | Freq: Once | ORAL | Status: AC
  Administered 2012-04-02: 7.5 mg via ORAL
  Filled 2012-04-02: qty 1

## 2012-04-02 NOTE — Op Note (Signed)
NAMEPADRAIG, NHAN           ACCOUNT NO.:  [id number]  MEDICAL RECORD NO.:  [id number]  LOCATION:  1610                         FACILITY:  [facility]  PHYSICIAN:  Vanita Panda. Magnus Ivan, M.D.DATE OF BIRTH:  11-29-1940  DATE OF PROCEDURE:  04/01/2012 DATE OF DISCHARGE:                              OPERATIVE REPORT   PREOPERATIVE DIAGNOSIS:  End-stage severe osteoarthritis, left hip.  POSTOPERATIVE DIAGNOSIS:  End-stage severe osteoarthritis, left hip.  PROCEDURE:  Left total hip arthroplasty through direct anterior approach.  IMPLANTS:  [employer] Gription acetabular component size 54, size 36+ 4 neutral polyethylene liner, size 14 Corail femoral component with standard offset, size 36+ 1.5 metal hip ball.  SURGEON:  Doneen Poisson MD.  ANESTHESIA:  General.  ANTIBIOTICS:  2 g IV Ancef.  BLOOD LOSS:  400-500 mL.  COMPLICATIONS:  None.  INDICATIONS:  Mr. Combes is a 71 year old active individual with known end-stage arthritis of his left hip.  This has been well documented through x-rays showing bone-on-bone wear as well as failure of injections.  His clinical exam also showing severe pain.  His activities of daily living are greatly limited, his mobility is limited as well. He wishes to proceed with a total hip arthroplasty.  He understands the direct anterior approach, and the risks and benefits of the surgery.  I have explained this to him, it is well documented in his history and physical exam.  PROCEDURE:  After informed consent was obtained, the appropriate left hip was marked.  He was brought to the operating room.  General anesthesia was obtained while he was on the stretcher.  A Foley catheter was then placed, then traction boots were placed on his feet so that he could be placed supine on the Hana fracture table.  He was placed supine on the table with a perineal post in place and both feet in inline skeletal traction devices, but no traction  applied.  His left hip was then prepped and draped with DuraPrep and sterile drapes.  Time-out was called.  He was identified as the correct patient, correct left hip.  I then made an incision just posterior and inferior to the anterosuperior iliac spine and carried this obliquely down the leg.  I was able to dissect down to the tensor fascia lata and divided the tensor fascia obliquely.  I then proceeded with a direct anterior approach to the hip. Cobra retractors were placed around the lateral neck and up under the femoral neck.  I divided the hip capsule and put the Cobra retractors within the hip capsule.  I made my femoral neck cut just proximal to the lesser trochanter and was able to complete this with an osteotome.  I then placed a corkscrew guide in the femoral head and removed the femoral head in its entirety.  I cleaned the acetabulum of debris.  I placed a Bent Hohmann medially and a Cobra retractor laterally.  We then began reaming from a size 44 reamer in 2 mm increments all the way to 54 with all reamers placed under direct visualization, but the last 2 placed under direct fluoroscopy as well, so I could obtain my depth of reaming my inclination and anteversion and abduction.  I then placed the real size 54 acetabular component without problems.  I placed the whole eliminator guide and the real 36+ 4 neutral polyethylene liner. Attention was then turned to the femur.  With all traction off the leg, the leg was externally rotated to 90 degrees, extended and adducted. This allowed access to the femoral canal.  Using a box cutting guide and a rongeur I then began broaching from a size 8 broach all the way to a size 14.  The size 14 was felt to be tight and stable.  I trialed a standard offset neck and a 36+ 1.5 hip ball.  We reduced this into the acetabulum and it was stable with internal and external rotation with a minimal shuck.  We were able to measure his leg lengths under  direct fluoroscopy and they were equal.  I then re-dislocated the hip and removed all trial components.  I then placed the real size 14 femoral component and the real 36+ 1.5 metal hip ball.  We reduced this into the acetabulum and again it was stable.  I copiously irrigated the soft tissues with normal saline solution and closed the joint capsule with interrupted #1 Ethibond suture.  A running V-Loc 0 suture was used to close the tensor fascia lata, 2-0 Vicryl in the subcutaneous tissue, and staples were used to close the skin.  Xeroform followed by a well-padded sterile dressing was applied.  The patient was awakened, extubated and taken off the Hana table.  His leg lengths were felt to be equal.  He was taken to recovery room in stable condition.  All final counts were correct.  There were no complications noted.     Vanita Panda. Magnus Ivan, M.D.     CYB/MEDQ  D:  04/01/2012  T:  04/02/2012  Job:  [id number]

## 2012-04-02 NOTE — Progress Notes (Signed)
Subjective: 1 Day Post-Op Procedure(s) (LRB): TOTAL HIP ARTHROPLASTY ANTERIOR APPROACH (Left) Patient reports pain as moderate.    Objective: Vital signs in last 24 hours: Temp:  [97.4 F (36.3 C)-98.2 F (36.8 C)] 98 F (36.7 C) (06/08 0447) Pulse Rate:  [63-99] 91  (06/08 0447) Resp:  [10-19] 14  (06/08 0800) BP: (117-149)/(72-86) 149/76 mmHg (06/08 0447) SpO2:  [97 %-100 %] 98 % (06/08 0800) Weight:  [85.73 kg (189 lb)] 85.73 kg (189 lb) (06/07 1100)  Intake/Output from previous day: 06/07 0701 - 06/08 0700 In: 4530 [P.O.:960; I.V.:3415; IV Piggyback:155] Out: 1925 [Urine:1425; Blood:500] Intake/Output this shift:     Basename 04/02/12 0447  HGB 12.6*    Basename 04/02/12 0447  WBC 9.9  RBC 4.04*  HCT 36.4*  PLT 221    Basename 04/02/12 0447  NA 130*  K 4.7  CL 96  CO2 27  BUN 14  CREATININE 0.87  GLUCOSE 111*  CALCIUM 8.3*    Basename 04/02/12 0447  LABPT --  INR 1.10    Sensation intact distally Intact pulses distally Dorsiflexion/Plantar flexion intact Incision: scant drainage  Assessment/Plan: 1 Day Post-Op Procedure(s) (LRB): TOTAL HIP ARTHROPLASTY ANTERIOR APPROACH (Left) Up with therapy  Kamala Kolton Y 04/02/2012, 9:16 AM

## 2012-04-02 NOTE — Evaluation (Signed)
Occupational Therapy Evaluation Patient Details Name: Matthew Collins MRN: [id number] DOB: Mar 22, 1941 Today's Date: 04/02/2012 Time: 4098-1191 OT Time Calculation (min): 15 min  OT Assessment / Plan / Recommendation Clinical Impression  Pt doing well POD 1 LTHR (anterior approach) All education completed. Pt will have necessary  level of A from family at d/c.    OT Assessment  Patient does not need any further OT services    Follow Up Recommendations  No OT follow up    Barriers to Discharge      Equipment Recommendations  None recommended by OT    Recommendations for Other Services    Frequency       Precautions / Restrictions Restrictions Weight Bearing Restrictions: No   Pertinent Vitals/Pain     ADL  Grooming: Simulated;Supervision/safety Where Assessed - Grooming: Supported standing Upper Body Bathing: Simulated;Set up Where Assessed - Upper Body Bathing: Supported sit to stand Lower Body Bathing: Simulated;Minimal assistance Where Assessed - Lower Body Bathing: Supported sit to stand Upper Body Dressing: Simulated;Set up Where Assessed - Upper Body Dressing: Unsupported sitting Lower Body Dressing: Simulated;Minimal assistance Where Assessed - Lower Body Dressing: Sopported sit to stand Toilet Transfer: Performed;Min guard Toilet Transfer Method: Sit to Barista: Regular height toilet;Grab bars Toileting - Architect and Hygiene: Simulated;Supervision/safety Where Assessed - Engineer, mining and Hygiene: Sit to stand from 3-in-1 or toilet Tub/Shower Transfer: Performed;Min guard Tub/Shower Transfer Method: Science writer: Walk in shower ADL Comments: min VCs for safe technique stepping into the shower. Pt mobilizing very well.    OT Diagnosis:    OT Problem List:   OT Treatment Interventions:     OT Goals    Visit Information  Last OT Received On: 04/02/12 Assistance Needed:  +1    Subjective Data  Subjective: Im not having much pain at all. Patient Stated Goal: Walk without this walker.   Prior Functioning  Home Living Lives With: Spouse Available Help at Discharge: Family;Available 24 hours/day Type of Home: House Home Access: Stairs to enter [employer] of Steps: 5 Entrance Stairs-Rails: Right;Left;Can reach both Home Layout: One level Bathroom Shower/Tub: Health visitor: Handicapped height (vanity beside.) Home Adaptive Equipment: Built-in shower seat;Shower chair with back;Walker - four wheeled Prior Function Level of Independence: Independent Able to Take Stairs?: Yes Driving: Yes Vocation: Retired Musician: No difficulties Dominant Hand: Right    Cognition  Overall Cognitive Status: Appears within functional limits for tasks assessed/performed Arousal/Alertness: Awake/alert Orientation Level: Appears intact for tasks assessed Behavior During Session: [facility] for tasks performed    Extremity/Trunk Assessment Right Upper Extremity Assessment RUE ROM/Strength/Tone: [facility] for tasks assessed Left Upper Extremity Assessment LUE ROM/Strength/Tone: WFL for tasks assessed   Mobility Transfers Transfers: Sit to Stand;Stand to Sit Sit to Stand: 4: Min guard;With upper extremity assist;With armrests;From chair/3-in-1;From toilet Stand to Sit: 4: Min guard;With upper extremity assist;To chair/3-in-1;To toilet;With armrests Details for Transfer Assistance: min VCs for hand placement and LE position.   Exercise    Balance    End of Session OT - End of Session Activity Tolerance: Patient tolerated treatment well Patient left: in chair;with call bell/phone within reach   Miyoko Hashimi A OTR/L [phone] 04/02/2012, 10:17 AM

## 2012-04-02 NOTE — Progress Notes (Signed)
ANTICOAGULATION CONSULT NOTE - Initial Consult  Pharmacy Consult for Warfarin Indication: VTE Prophylaxis following L THA  No Known Allergies  Patient Measurements: Height: 6' 1.5" (186.7 cm) Weight: 189 lb (85.73 kg) IBW/kg (Calculated) : 81.05    Vital Signs: Temp: 98 F (36.7 C) (06/08 0447) BP: 149/76 mmHg (06/08 0447) Pulse Rate: 91  (06/08 0447)  Labs:  Basename 04/02/12 0447  HGB 12.6*  HCT 36.4*  PLT 221  APTT --  LABPROT 14.4  INR 1.10  HEPARINUNFRC --  CREATININE 0.87  CKTOTAL --  CKMB --  TROPONINI --    Estimated Creatinine Clearance: 91.9 ml/min (by C-G formula based on Cr of 0.87).   Medical History: Past Medical History  Diagnosis Date  . Hypercholesterolemia   . Osteoarthritis   . Tinnitus     both ears    Medications:  Scheduled:     .  ceFAZolin (ANCEF) IV  1 g Intravenous Q6H  . coumadin book   Does not apply Once  . docusate sodium  100 mg Oral BID  . HYDROmorphone      . HYDROmorphone      . morphine   Intravenous Q4H  . multivitamin with minerals  1 tablet Oral Daily  . simvastatin  40 mg Oral QHS  . warfarin  7.5 mg Oral ONCE-1800  . warfarin   Does not apply Once  . Warfarin - Pharmacist Dosing Inpatient   Does not apply q1800  . DISCONTD: warfarin  5 mg Oral ONCE-1800   Infusions:     . sodium chloride 75 mL/hr at 04/02/12 0800  . DISCONTD: lactated ringers     PRN: acetaminophen, acetaminophen, ALPRAZolam, alum & mag hydroxide-simeth, diphenhydrAMINE, diphenhydrAMINE, diphenhydrAMINE, diphenhydrAMINE, diphenhydrAMINE, HYDROcodone-acetaminophen, menthol-cetylpyridinium, methocarbamol (ROBAXIN) IV, methocarbamol, metoCLOPramide (REGLAN) injection, metoCLOPramide, morphine injection, naloxone, naloxone, ondansetron (ZOFRAN) IV, ondansetron (ZOFRAN) IV, ondansetron (ZOFRAN) IV, ondansetron, oxyCODONE, phenol sodium chloride, sodium chloride, zolpidem, DISCONTD:  HYDROmorphone (DILAUDID) injection  Assessment:  71 y/o M  s/p L THA (anterior approach) 6/7 initiating coumadin for vte prophylaxis  INR still low after one dose as expecting  Repeat coumadin 7.5 mg tonight    Goal of Therapy:  INR 2-3    Plan:  1. Warfarin 7.5mg  PO x 1 tonight 2. PT/INR daily   Tyshaun Vinzant, Loma Messing PharmD 9:32 AM 04/02/2012

## 2012-04-02 NOTE — Evaluation (Signed)
Physical Therapy Evaluation Patient Details Name: Matthew Collins MRN: [id number] DOB: 13-Sep-1941 Today's Date: 04/02/2012 Time:  -     PT Assessment / Plan / Recommendation Clinical Impression       PT Assessment  Patient needs continued PT services    Follow Up Recommendations  Home health PT    Barriers to Discharge        lEquipment Recommendations  None recommended by PT    Recommendations for Other Services OT consult   Frequency 7X/week    Precautions / Restrictions Restrictions Weight Bearing Restrictions: No Other Position/Activity Restrictions: WBAT         Mobility  Bed Mobility Bed Mobility: Supine to Sit Supine to Sit: 4: Min assist Details for Bed Mobility Assistance: cues for sequence and use of R LE to self assist Transfers Transfers: Sit to Stand;Stand to Sit Sit to Stand: 4: Min assist Stand to Sit: 4: Min assist Details for Transfer Assistance: cues for LE management and use of UEs to self assist Ambulation/Gait Ambulation/Gait Assistance: 4: Min assist Assistive device: Rolling walker Gait Pattern: Step-to pattern    Exercises Total Joint Exercises Ankle Circles/Pumps: AROM;10 reps;Supine;Both Quad Sets: AROM;10 reps;Supine;Both Heel Slides: AAROM;10 reps;Left;Supine Hip ABduction/ADduction: AAROM;10 reps;Supine;Left   PT Diagnosis: Difficulty walking  PT Problem List:   PT Treatment Interventions: DME instruction;Gait training;Stair training;Functional mobility training;Therapeutic activities;Therapeutic exercise;Patient/family education   PT Goals Acute Rehab PT Goals PT Goal Formulation: With patient Time For Goal Achievement: 04/08/12 Potential to Achieve Goals: Good Pt will go Supine/Side to Sit: with supervision Pt will go Sit to Supine/Side: with supervision Pt will go Sit to Stand: with supervision Pt will go Stand to Sit: with supervision Pt will Ambulate: with supervision;51 - 150 feet;>150 feet;with rolling walker Pt  will Go Up / Down Stairs: 3-5 stairs;with least restrictive assistive device;with min assist  Visit Information  Assistance Needed: +1    Subjective Data  Subjective: This hip is for my 71th birthday Patient Stated Goal: Go to the beach   Prior Functioning  Home Living Lives With: Spouse Available Help at Discharge: Family Type of Home: House Home Access: Stairs to enter Secretary/administrator of Steps: 5 Entrance Stairs-Rails: Can reach both Home Layout: One level Bathroom Shower/Tub: Health visitor: Handicapped height (vanity beside.) Home Adaptive Equipment: Environmental consultant - four wheeled Prior Function Level of Independence: Independent Able to Take Stairs?: Yes Driving: Yes Vocation: Retired Musician: No difficulties Dominant Hand: Right    Cognition  Overall Cognitive Status: Appears within functional limits for tasks assessed/performed Arousal/Alertness: Awake/alert Orientation Level: Appears intact for tasks assessed Behavior During Session: [facility] for tasks performed    Extremity/Trunk Assessment Right Upper Extremity Assessment RUE ROM/Strength/Tone: Within functional levels Left Upper Extremity Assessment LUE ROM/Strength/Tone: Within functional levels Right Lower Extremity Assessment RLE ROM/Strength/Tone: Within functional levels Left Lower Extremity Assessment LLE ROM/Strength/Tone: Deficits LLE ROM/Strength/Tone Deficits: hip flex to 90, abd to 20 AAROM, hip strength 2+/5   Balance    End of Session PT - End of Session Equipment Utilized During Treatment: Gait belt Activity Tolerance: Patient tolerated treatment well Patient left: in chair;with call bell/phone within reach Nurse Communication: Mobility status   Harly Pipkins 04/02/2012, 1:32 PM

## 2012-04-03 LAB — CBC
HCT: 34.6 % — ABNORMAL LOW (ref 39.0–52.0)
Hemoglobin: 11.7 g/dL — ABNORMAL LOW (ref 13.0–17.0)
MCH: 30.4 pg (ref 26.0–34.0)
MCHC: 33.8 g/dL (ref 30.0–36.0)
MCV: 89.9 fL (ref 78.0–100.0)
Platelets: 178 10*3/uL (ref 150–400)
RBC: 3.85 MIL/uL — ABNORMAL LOW (ref 4.22–5.81)
RDW: 12.5 % (ref 11.5–15.5)
WBC: 7.3 10*3/uL (ref 4.0–10.5)

## 2012-04-03 LAB — PROTIME-INR
INR: 1.51 — ABNORMAL HIGH (ref 0.00–1.49)
Prothrombin Time: 18.5 s — ABNORMAL HIGH (ref 11.6–15.2)

## 2012-04-03 MED ORDER — WARFARIN SODIUM 5 MG PO TABS: 5.0000 mg | ORAL_TABLET | Freq: Every day | ORAL | Status: DC

## 2012-04-03 MED ORDER — WARFARIN SODIUM 4 MG PO TABS
4.0000 mg | ORAL_TABLET | Freq: Once | ORAL | Status: AC
  Filled 2012-04-03: qty 1

## 2012-04-03 NOTE — Discharge Summary (Signed)
Patient ID: Matthew Collins MRN: [id number] DOB/AGE: 70-Nov-1942 70 y.o.  Admit date: 04/01/2012 Discharge date: 04/03/2012  Admission Diagnoses:  Principal Problem:  *Degenerative arthritis of hip   Discharge Diagnoses:  Same  Past Medical History  Diagnosis Date  . Hypercholesterolemia   . Osteoarthritis   . Tinnitus     both ears    Surgeries: Procedure(s): TOTAL HIP ARTHROPLASTY ANTERIOR APPROACH on 04/01/2012   Consultants:    Discharged Condition: Improved  Hospital Course: Matthew Collins is an 71 y.o. male who was admitted 04/01/2012 for operative treatment ofDegenerative arthritis of hip. Patient has severe unremitting pain that affects sleep, daily activities, and work/hobbies. After pre-op clearance the patient was taken to the operating room on 04/01/2012 and underwent  Procedure(s): TOTAL HIP ARTHROPLASTY ANTERIOR APPROACH.    Patient was given perioperative antibiotics: Anti-infectives     Start     Dose/Rate Route Frequency Ordered Stop   04/01/12 1400   ceFAZolin (ANCEF) IVPB 1 g/50 mL premix        1 g 100 mL/hr over 30 Minutes Intravenous Every 6 hours 04/01/12 1118 04/02/12 0208   04/01/12 0546   ceFAZolin (ANCEF) IVPB 2 g/50 mL premix        2 g 100 mL/hr over 30 Minutes Intravenous 60 min pre-op 04/01/12 0546 04/01/12 0742           Patient was given sequential compression devices, early ambulation, and chemoprophylaxis to prevent DVT.  Patient benefited maximally from hospital stay and there were no complications.    Recent vital signs: Patient Vitals for the past 24 hrs:  BP Temp Temp src Pulse Resp SpO2  04/03/12 0555 115/74 mmHg 97.9 F (36.6 C) Oral 95  20  96 %  04/02/12 2120 143/78 mmHg 97.8 F (36.6 C) Oral 95  20  98 %  04/02/12 1410 121/81 mmHg 98.3 F (36.8 C) Oral 91  16  99 %  04/02/12 1200 - - - - 14  99 %  04/02/12 1100 134/82 mmHg 98.7 F (37.1 C) Oral 86  16  99 %     Recent laboratory studies:  Basename 04/03/12 0510  04/02/12 0447  WBC 7.3 9.9  HGB 11.7* 12.6*  HCT 34.6* 36.4*  PLT 178 221  NA -- 130*  K -- 4.7  CL -- 96  CO2 -- 27  BUN -- 14  CREATININE -- 0.87  GLUCOSE -- 111*  INR 1.51* 1.10  CALCIUM -- 8.3*     Discharge Medications:   Medication List  As of 04/03/2012  8:50 AM   STOP taking these medications         ALEVE PO         TAKE these medications         ALPRAZolam 0.25 MG tablet   Commonly known as: XANAX   Take 0.25 mg by mouth at bedtime as needed. sleep      HYDROcodone-acetaminophen 5-325 MG per tablet   Commonly known as: NORCO   Take 0.5-1 tablets by mouth every 6 (six) hours as needed. Pain        multivitamin with minerals Tabs   Take 1 tablet by mouth daily.      OVER THE COUNTER MEDICATION   ISOTONIC OPC 3 POWDER Q DAY      simvastatin 40 MG tablet   Commonly known as: ZOCOR   Take 1 tablet (40 mg total) by mouth at bedtime.      tetrahydrozoline 0.05 % ophthalmic  solution   Place 1 drop into both eyes 2 (two) times daily as needed. Dry eyes      tolnaftate 1 % cream   Commonly known as: TINACTIN   Apply 1 application topically 2 (two) times daily as needed. Itching      warfarin 5 MG tablet   Commonly known as: COUMADIN   Take 1 tablet (5 mg total) by mouth daily.            Diagnostic Studies: Dg Hip Complete Left  04/01/2012  *RADIOLOGY REPORT*  Clinical Data: Hip replacement.  LEFT HIP - COMPLETE 2+ VIEW  Comparison: None.  Findings: Changes of left hip replacement.  No hardware or bony complicating feature.  Normal AP alignment.  IMPRESSION: Left hip replacement.  No complicating feature.  Original Report Authenticated By: Cyndie Chime, M.D.   Dg Pelvis Portable  04/01/2012  *RADIOLOGY REPORT*  Clinical Data: Postop left hip replacement.  PORTABLE PELVIS  Comparison: 04/01/2012 intraoperative imaging.  Findings: Changes of left hip replacement.  Soft tissue gas and skin staples noted.  Normal alignment.  No complicating feature. Mild  degenerative changes in the right hip joint.  IMPRESSION: Left hip replacement.  No complicating feature.  Original Report Authenticated By: Cyndie Chime, M.D.   Dg Hip Portable 1 View Left  04/01/2012  *RADIOLOGY REPORT*  Clinical Data: Postop left hip.  PORTABLE LEFT HIP - 1 VIEW  Comparison: 04/01/2012  Findings: Cross-table lateral view demonstrates changes of left hip replacement.  Normal alignment.  No complicating feature.  IMPRESSION: Left hip replacement.  No complicating feature.  Original Report Authenticated By: Cyndie Chime, M.D.   Dg C-arm 1-60 Min-no Report  04/01/2012  CLINICAL DATA: hip film   C-ARM 1-60 MINUTES  Fluoroscopy was utilized by the requesting physician.  No radiographic  interpretation.      Disposition:  To home  Discharge Orders    Future Orders Please Complete By Expires   Diet - low sodium heart healthy      Call MD / Call 911      Comments:   If you experience chest pain or shortness of breath, CALL 911 and be transported to the hospital emergency room.  If you develope a fever above 101 F, pus (white drainage) or increased drainage or redness at the wound, or calf pain, call your surgeon's office.   Constipation Prevention      Comments:   Drink plenty of fluids.  Prune juice may be helpful.  You may use a stool softener, such as Colace (over the counter) 100 mg twice a day.  Use MiraLax (over the counter) for constipation as needed.   Increase activity slowly as tolerated      Discharge instructions      Comments:   You can increase your activities as comfort allows.  Ice as needed left hip. You can get your current dressing wet in the shower.  You can get your actual dressing wet starting this Wed. Expect leg swelling down to your foot. Call [facility] with concerns [phone]. Follow-up in 2 weeks.   Discharge patient            Signed: Kathryne Hitch 04/03/2012, 8:50 AM

## 2012-04-03 NOTE — Progress Notes (Signed)
Subjective: 2 Days Post-Op Procedure(s) (LRB): TOTAL HIP ARTHROPLASTY ANTERIOR APPROACH (Left) Patient reports pain as mild.    Objective: Vital signs in last 24 hours: Temp:  [97.8 F (36.6 C)-98.7 F (37.1 C)] 97.9 F (36.6 C) (06/09 0555) Pulse Rate:  [86-95] 95  (06/09 0555) Resp:  [14-20] 20  (06/09 0555) BP: (115-143)/(74-82) 115/74 mmHg (06/09 0555) SpO2:  [96 %-99 %] 96 % (06/09 0555)  Intake/Output from previous day: 06/08 0701 - 06/09 0700 In: 770 [P.O.:720; IV Piggyback:50] Out: 2625 [Urine:2625] Intake/Output this shift: Total I/O In: 240 [P.O.:240] Out: 600 [Urine:600]   Basename 04/03/12 0510 04/02/12 0447  HGB 11.7* 12.6*    Basename 04/03/12 0510 04/02/12 0447  WBC 7.3 9.9  RBC 3.85* 4.04*  HCT 34.6* 36.4*  PLT 178 221    Basename 04/02/12 0447  NA 130*  K 4.7  CL 96  CO2 27  BUN 14  CREATININE 0.87  GLUCOSE 111*  CALCIUM 8.3*    Basename 04/03/12 0510 04/02/12 0447  LABPT -- --  INR 1.51* 1.10    Sensation intact distally Intact pulses distally Dorsiflexion/Plantar flexion intact Incision: scant drainage  Assessment/Plan: 2 Days Post-Op Procedure(s) (LRB): TOTAL HIP ARTHROPLASTY ANTERIOR APPROACH (Left) Discharge to home today  Kathryne Hitch 04/03/2012, 8:47 AM

## 2012-04-03 NOTE — Progress Notes (Addendum)
Cm spoke with pt concerning dc planning. Per pt choice [facility] to provide [facility] services upon dc. [facility] rep Talmadge Coventry notified of referral. HHRN draw PT/INR 04/05/12. Pt's spouse to assist in home care. Pt has access to DME. No other needs stated.   Leonie Green [phone]

## 2012-04-03 NOTE — Progress Notes (Signed)
ANTICOAGULATION CONSULT NOTE - Initial Consult  Pharmacy Consult for Warfarin Indication: VTE Prophylaxis following L THA  No Known Allergies  Patient Measurements: Height: 6' 1.5" (186.7 cm) Weight: 189 lb (85.73 kg) IBW/kg (Calculated) : 81.05    Vital Signs: Temp: 97.9 F (36.6 C) (06/09 0555) Temp src: Oral (06/09 0555) BP: 115/74 mmHg (06/09 0555) Pulse Rate: 95  (06/09 0555)  Labs:  Basename 04/03/12 0510 04/02/12 0447  HGB 11.7* 12.6*  HCT 34.6* 36.4*  PLT 178 221  APTT -- --  LABPROT 18.5* 14.4  INR 1.51* 1.10  HEPARINUNFRC -- --  CREATININE -- 0.87  CKTOTAL -- --  CKMB -- --  TROPONINI -- --    Estimated Creatinine Clearance: 91.9 ml/min (by C-G formula based on Cr of 0.87).   Medical History: Past Medical History  Diagnosis Date  . Hypercholesterolemia   . Osteoarthritis   . Tinnitus     both ears    Medications:  Scheduled:     . docusate sodium  100 mg Oral BID  . multivitamin with minerals  1 tablet Oral Daily  . simvastatin  40 mg Oral QHS  . warfarin  7.5 mg Oral ONCE-1800  . warfarin   Does not apply Once  . Warfarin - Pharmacist Dosing Inpatient   Does not apply q1800  . DISCONTD: morphine   Intravenous Q4H   Infusions:     . DISCONTD: sodium chloride 75 mL/hr at 04/02/12 0800   PRN: acetaminophen, acetaminophen, ALPRAZolam, alum & mag hydroxide-simeth, diphenhydrAMINE, HYDROcodone-acetaminophen, menthol-cetylpyridinium, methocarbamol (ROBAXIN) IV, methocarbamol, metoCLOPramide (REGLAN) injection, metoCLOPramide, morphine injection, ondansetron (ZOFRAN) IV, ondansetron, oxyCODONE, phenol, zolpidem, DISCONTD: diphenhydrAMINE, DISCONTD: diphenhydrAMINE, DISCONTD: diphenhydrAMINE, DISCONTD: diphenhydrAMINE, DISCONTD: naloxone DISCONTD: naloxone, DISCONTD: ondansetron (ZOFRAN) IV, DISCONTD: ondansetron (ZOFRAN) IV, DISCONTD: sodium chloride, DISCONTD: sodium chloride  Assessment:  71 y/o M s/p L THA (anterior approach) 6/7 initiating  coumadin for vte prophylaxis  INR below goal but moving toward goal quickly  CBC okay  No bleeding/complications reported   Goal of Therapy:  INR 2-3    Plan:  1. Warfarin 4 mg PO x 1 tonight 2. PT/INR daily   Luree Palla, Loma Messing PharmD 7:10 AM 04/03/2012

## 2012-04-03 NOTE — Progress Notes (Addendum)
Physical Therapy Treatment Patient Details Name: Matthew Collins MRN: [id number] DOB: 1941-04-20 Today's Date: 04/03/2012 Time: 8119-1478 PT Time Calculation (min): 27 min  PT Assessment / Plan / Recommendation Comments on Treatment Session  Pt doing extremely well and eager for d/c    Follow Up Recommendations  Home health PT    Barriers to Discharge        Equipment Recommendations  None recommended by PT    Recommendations for Other Services    Frequency 7X/week   Plan Discharge plan remains appropriate    Precautions / Restrictions Precautions Precautions: None Restrictions Weight Bearing Restrictions: No Other Position/Activity Restrictions: WBAT   Pertinent Vitals/Pain     Mobility  Transfers Transfers: Sit to Stand;Stand to Sit Sit to Stand: 6: Modified independent (Device/Increase time) Stand to Sit: 6: Modified independent (Device/Increase time) Ambulation/Gait Ambulation/Gait Assistance: 5: Supervision Ambulation Distance (Feet): 300 Feet Assistive device: Rolling walker Ambulation/Gait Assistance Details: min cues for posture and position from RW Gait Pattern: Step-through pattern General Gait Details: used 4 wheeled RW like pt has at home, feels comfortable  with this  Up/down 5 steps x 4 with bil rails and min/guard; verbal cues for sequence  Exercises Total Joint Exercises Ankle Circles/Pumps: AROM;10 reps;Supine;Both Quad Sets: AROM;10 reps;Supine;Both Long Arc Quad: AROM;10 reps;Left;Seated Knee Flexion: AROM;Left;Standing;10 reps Marching in Standing: AROM;Left;10 reps   PT Diagnosis:    PT Problem List:   PT Treatment Interventions:     PT Goals Acute Rehab PT Goals Time For Goal Achievement: 04/08/12 Potential to Achieve Goals: Good Pt will go Sit to Stand: with supervision PT Goal: Sit to Stand - Progress: Met Pt will go Stand to Sit: with supervision PT Goal: Stand to Sit - Progress: Met Pt will Ambulate: with supervision;51 - 150  feet;>150 feet;with rolling walker PT Goal: Ambulate - Progress: Met Pt will Go Up / Down Stairs: 3-5 stairs;with least restrictive assistive device;with min assist PT Goal: Up/Down Stairs - Progress: Met  Visit Information  Last PT Received On: 04/03/12 Assistance Needed: +1    Subjective Data  Subjective: I feel great Patient Stated Goal: Go to the beach   Cognition  Overall Cognitive Status: Appears within functional limits for tasks assessed/performed Arousal/Alertness: Awake/alert Orientation Level: Appears intact for tasks assessed Behavior During Session: [facility] for tasks performed    Balance     End of Session PT - End of Session Equipment Utilized During Treatment: Gait belt Activity Tolerance: Patient tolerated treatment well Patient left: in chair;with call bell/phone within reach Nurse Communication: Mobility status    [facility] 04/03/2012, 10:20 AM

## 2012-04-06 ENCOUNTER — Encounter (HOSPITAL_COMMUNITY): Payer: Self-pay | Admitting: Orthopaedic Surgery

## 2012-06-06 ENCOUNTER — Other Ambulatory Visit: Payer: Self-pay | Admitting: Cardiology

## 2012-06-06 DIAGNOSIS — F419 Anxiety disorder, unspecified: Secondary | ICD-10-CM

## 2012-06-06 NOTE — Telephone Encounter (Signed)
Refill on alprazolam 

## 2012-09-26 ENCOUNTER — Other Ambulatory Visit: Payer: Self-pay | Admitting: Cardiology

## 2012-09-26 DIAGNOSIS — F419 Anxiety disorder, unspecified: Secondary | ICD-10-CM

## 2012-09-26 MED ORDER — ALPRAZOLAM 0.25 MG PO TABS: 0.2500 mg | ORAL_TABLET | Freq: Every day | ORAL | Status: DC | PRN

## 2012-09-26 NOTE — Telephone Encounter (Signed)
Pt has questions regarding his medication and needs a call

## 2012-12-02 ENCOUNTER — Telehealth: Payer: Self-pay | Admitting: Cardiology

## 2012-12-02 DIAGNOSIS — D649 Anemia, unspecified: Secondary | ICD-10-CM

## 2012-12-02 DIAGNOSIS — E7800 Pure hypercholesterolemia, unspecified: Secondary | ICD-10-CM

## 2012-12-02 NOTE — Telephone Encounter (Signed)
New Problem     Pt would like to speak to you about an upcoming appt.

## 2012-12-02 NOTE — Telephone Encounter (Signed)
Scheduled ov and labs for April

## 2013-02-09 ENCOUNTER — Other Ambulatory Visit (INDEPENDENT_AMBULATORY_CARE_PROVIDER_SITE_OTHER): Payer: Medicare Other

## 2013-02-09 ENCOUNTER — Other Ambulatory Visit: Payer: Self-pay | Admitting: *Deleted

## 2013-02-09 DIAGNOSIS — D649 Anemia, unspecified: Secondary | ICD-10-CM

## 2013-02-09 DIAGNOSIS — E7800 Pure hypercholesterolemia, unspecified: Secondary | ICD-10-CM

## 2013-02-09 LAB — CBC WITH DIFFERENTIAL/PLATELET
Basophils Relative: 0.8 % (ref 0.0–3.0)
Eosinophils Relative: 1.9 % (ref 0.0–5.0)
HCT: 44.7 % (ref 39.0–52.0)
Hemoglobin: 15 g/dL (ref 13.0–17.0)
Lymphocytes Relative: 30.3 % (ref 12.0–46.0)
Monocytes Relative: 11.5 % (ref 3.0–12.0)
Neutro Abs: 2.8 10*3/uL (ref 1.4–7.7)
RBC: 4.94 Mil/uL (ref 4.22–5.81)

## 2013-02-09 NOTE — Progress Notes (Signed)
Quick Note:  Please make copy of labs for patient visit. ______ 

## 2013-02-10 ENCOUNTER — Other Ambulatory Visit (INDEPENDENT_AMBULATORY_CARE_PROVIDER_SITE_OTHER): Payer: Medicare Other

## 2013-02-10 DIAGNOSIS — E7800 Pure hypercholesterolemia, unspecified: Secondary | ICD-10-CM

## 2013-02-10 LAB — HEPATIC FUNCTION PANEL
ALT: 22 U/L (ref 0–53)
Albumin: 4.1 g/dL (ref 3.5–5.2)
Bilirubin, Direct: 0.1 mg/dL (ref 0.0–0.3)
Total Protein: 6.8 g/dL (ref 6.0–8.3)

## 2013-02-10 LAB — BASIC METABOLIC PANEL
BUN: 14 mg/dL (ref 6–23)
CO2: 27 mEq/L (ref 19–32)
Chloride: 99 mEq/L (ref 96–112)
Creatinine, Ser: 1.2 mg/dL (ref 0.4–1.5)
Glucose, Bld: 88 mg/dL (ref 70–99)

## 2013-02-10 LAB — LIPID PANEL
Total CHOL/HDL Ratio: 3
Triglycerides: 77 mg/dL (ref 0.0–149.0)

## 2013-02-10 NOTE — Progress Notes (Signed)
Quick Note:  Please make copy of labs for patient visit. ______ 

## 2013-02-13 ENCOUNTER — Encounter: Payer: Self-pay | Admitting: Cardiology

## 2013-02-13 ENCOUNTER — Ambulatory Visit (INDEPENDENT_AMBULATORY_CARE_PROVIDER_SITE_OTHER): Payer: Medicare Other | Admitting: Cardiology

## 2013-02-13 ENCOUNTER — Encounter: Payer: Self-pay | Admitting: *Deleted

## 2013-02-13 VITALS — BP 126/72 | HR 76 | Ht 73.0 in | Wt 195.1 lb

## 2013-02-13 DIAGNOSIS — M169 Osteoarthritis of hip, unspecified: Secondary | ICD-10-CM

## 2013-02-13 DIAGNOSIS — E7800 Pure hypercholesterolemia, unspecified: Secondary | ICD-10-CM

## 2013-02-13 DIAGNOSIS — M1610 Unilateral primary osteoarthritis, unspecified hip: Secondary | ICD-10-CM

## 2013-02-13 DIAGNOSIS — M1990 Unspecified osteoarthritis, unspecified site: Secondary | ICD-10-CM

## 2013-02-13 DIAGNOSIS — G4700 Insomnia, unspecified: Secondary | ICD-10-CM

## 2013-02-13 MED ORDER — ALPRAZOLAM 0.25 MG PO TABS: 0.2500 mg | ORAL_TABLET | Freq: Two times a day (BID) | ORAL | Status: DC

## 2013-02-13 NOTE — Progress Notes (Signed)
[name] Date of Birth:  1940-10-29 [facility] [address] [place], [place]  [address] [phone]         Fax   [phone]  History of Present Illness: This pleasant 72 year old gentleman is seen for a one-year followup office visit. He has a history of hypercholesterolemia and is on simvastatin 40 mg daily. He also has a past history of osteoarthritis. He has had previous right shoulder surgery and right foot surgery. In June he had a left total hip replacement by Dr. Rayburn Ma.  He is very pleased with the results from his hip surgery and it has also helped his low back pain. Patient has a strong family history of ischemic heart disease. He himself however has not been experiencing any chest pain or shortness of breath. His blood pressure has been normal and he is not diabetic.   Current Outpatient Prescriptions  Medication Sig Dispense Refill  . ALPRAZolam (XANAX) 0.25 MG tablet Take 0.25 mg by mouth 2 (two) times daily.      Marland Kitchen HYDROcodone-acetaminophen (NORCO) 5-325 MG per tablet Take 0.5-1 tablets by mouth every 6 (six) hours as needed. Pain       . Multiple Vitamins-Minerals (MULTIVITAMIN PO) Take by mouth. istonic powder daily      . OVER THE COUNTER MEDICATION ISOTONIC OPC 3 POWDER Q DAY      . simvastatin (ZOCOR) 40 MG tablet Take 1 tablet (40 mg total) by mouth at bedtime.  90 tablet  3  . tetrahydrozoline 0.05 % ophthalmic solution Place 1 drop into both eyes 2 (two) times daily as needed. Dry eyes      . tolnaftate (TINACTIN) 1 % cream Apply 1 application topically 2 (two) times daily as needed. Itching       No current facility-administered medications for this visit.    No Known Allergies  Patient Active Problem List  Diagnosis  . Hypercholesterolemia  . Osteoarthritis  . Degenerative arthritis of hip    History  Smoking status  . Former Smoker -- 10 years  . Types: Cigarettes  . Quit date: 02/09/1974  Smokeless tobacco  .  Former Neurosurgeon    History  Alcohol Use  . Yes    Comment: 2 beer day    Family History  Problem Relation Age of Onset  . Heart disease Mother 31  . Heart failure Mother 22  . Heart failure Father 55    Review of Systems: Constitutional: no fever chills diaphoresis or fatigue or change in weight.  Head and neck: no hearing loss, no epistaxis, no photophobia or visual disturbance. Respiratory: No cough, shortness of breath or wheezing. Cardiovascular: No chest pain peripheral edema, palpitations. Gastrointestinal: No abdominal distention, no abdominal pain, no change in bowel habits hematochezia or melena. Genitourinary: No dysuria, no frequency, no urgency, no nocturia. Musculoskeletal:No arthralgias, no back pain, no gait disturbance or myalgias. Neurological: No dizziness, no headaches, no numbness, no seizures, no syncope, no weakness, no tremors. Hematologic: No lymphadenopathy, no easy bruising. Psychiatric: No confusion, no hallucinations, no sleep disturbance.    Physical Exam: Filed Vitals:   02/13/13 1446  BP: 126/72  Pulse: 76   the general appearance reveals a well-developed well-nourished gentleman in no distress.The head and neck exam reveals pupils equal and reactive.  Extraocular movements are full.  There is no scleral icterus.  The mouth and pharynx are normal.  The neck is supple.  The carotids reveal no bruits.  The jugular venous pressure is normal.  The  thyroid is not enlarged.  There is no lymphadenopathy.  The chest is clear to percussion and auscultation.  There are no rales or rhonchi.  Expansion of the chest is symmetrical.  The precordium is quiet.  The first heart sound is normal.  The second heart sound is physiologically split.  There is no murmur gallop rub or click.  There is no abnormal lift or heave.  The abdomen is soft and nontender.  The bowel sounds are normal.  The liver and spleen are not enlarged.  There are no abdominal masses.  There are no  abdominal bruits.  Extremities reveal good pedal pulses.  There is no phlebitis or edema.  There is no cyanosis or clubbing.  Strength is normal and symmetrical in all extremities.  There is no lateralizing weakness.  There are no sensory deficits.  The skin is warm and dry.  There is no rash.     Assessment / Plan: Continue same medication.  Refilled his Xanax 0.25 mg twice a day when necessary.  He has difficulty sleeping at night frequently. Recheck in one year for office visit EKG lipid panel hepatic function panel basal metabolic panel and CBC.

## 2013-02-13 NOTE — Assessment & Plan Note (Signed)
The patient is walking for exercise.  He has been going to the gym at the [facility] days a week.  His weight is up 12 pounds from improving his muscle tone.

## 2013-02-13 NOTE — Assessment & Plan Note (Signed)
The patient is tolerating simvastatin 40 mg daily without any side effects.  Blood work is satisfactory

## 2013-02-13 NOTE — Patient Instructions (Addendum)
Your physician wants you to follow-up in: 1 YEAR OV/LP/BMET/HFP/CBC You will receive a reminder letter in the mail two months in advance. If you don't receive a letter, please call our office to schedule the follow-up appointment.   CHANGE YOUR XANAX TO TIWCE A DAY AS NEEDED

## 2013-02-17 ENCOUNTER — Other Ambulatory Visit: Payer: Self-pay | Admitting: *Deleted

## 2013-02-17 MED ORDER — SIMVASTATIN 40 MG PO TABS: 40.0000 mg | ORAL_TABLET | Freq: Every day | ORAL | Status: DC

## 2013-03-14 ENCOUNTER — Encounter: Payer: Self-pay | Admitting: Physical Medicine & Rehabilitation

## 2013-03-27 ENCOUNTER — Encounter: Payer: Self-pay | Admitting: Physical Medicine & Rehabilitation

## 2013-03-27 ENCOUNTER — Ambulatory Visit (HOSPITAL_BASED_OUTPATIENT_CLINIC_OR_DEPARTMENT_OTHER): Payer: Medicare Other | Admitting: Physical Medicine & Rehabilitation

## 2013-03-27 ENCOUNTER — Encounter: Payer: Medicare Other | Attending: Physical Medicine & Rehabilitation

## 2013-03-27 VITALS — BP 172/69 | HR 82 | Resp 14 | Ht 71.0 in | Wt 194.0 lb

## 2013-03-27 DIAGNOSIS — Z5181 Encounter for therapeutic drug level monitoring: Secondary | ICD-10-CM

## 2013-03-27 DIAGNOSIS — M169 Osteoarthritis of hip, unspecified: Secondary | ICD-10-CM

## 2013-03-27 DIAGNOSIS — G8929 Other chronic pain: Secondary | ICD-10-CM | POA: Insufficient documentation

## 2013-03-27 DIAGNOSIS — M79609 Pain in unspecified limb: Secondary | ICD-10-CM | POA: Insufficient documentation

## 2013-03-27 DIAGNOSIS — Z79899 Other long term (current) drug therapy: Secondary | ICD-10-CM

## 2013-03-27 DIAGNOSIS — M19079 Primary osteoarthritis, unspecified ankle and foot: Secondary | ICD-10-CM | POA: Insufficient documentation

## 2013-03-27 DIAGNOSIS — M1611 Unilateral primary osteoarthritis, right hip: Secondary | ICD-10-CM

## 2013-03-27 DIAGNOSIS — M549 Dorsalgia, unspecified: Secondary | ICD-10-CM | POA: Insufficient documentation

## 2013-03-27 MED ORDER — TRAMADOL HCL 50 MG PO TABS: 50.0000 mg | ORAL_TABLET | Freq: Every evening | ORAL | Status: AC | PRN

## 2013-03-27 NOTE — Patient Instructions (Signed)
Tramadol 50mg  1-2 tablets at night in place of hydrocodone  Gabapentin or neurontin can be helpful if this turns out to be paged nerve in the L5 or S1 area  Tens unit to the low back area may be helpful although it's difficult to predict. I do not think he would be helpful on the foot or toe area  Use heat 20-30 minutes as needed for low back pain.  Back off on working out for the next one to 2 weeks and then resume at one half of your normal weights and slowly build up again

## 2013-03-27 NOTE — Progress Notes (Signed)
Subjective:    Patient ID: Matthew Collins, male    DOB: [birth year], 72 y.o.   MRN: [id number]  HPI Chief complaint is back and foot pain History 72 year old male with end-stage osteo arthritis of the left hip who underwent left hip hemiarthroplasty on 04/01/2012. Has had back pain for for approximately 40 years used her to keep it under control with exercise. No history of lumbar surgery. No formal physical therapy in the past.  Has history of severe osteoarthritis right great toe, no surgical procedures on this.  Pain Inventory Average Pain 4 Pain Right Now 4 My pain is burning, dull and aching  In the last 24 hours, has pain interfered with the following? General activity 5 Relation with others 5 Enjoyment of life 6 What TIME of day is your pain at its worst? daytime evening and night Sleep (in general) Fair  Pain is worse with: bending, sitting, inactivity and standing Pain improves with: rest, heat/ice, therapy/exercise, medication and injections Relief from Meds: 7  Mobility walk without assistance how many minutes can you walk? 30 ability to climb steps?  yes do you drive?  yes  Function retired  Neuro/Psych No problems in this area  Prior Studies Any changes since last visit?  no  Physicians involved in your care Any changes since last visit?  yes Orthopedist Blackmon   Family History  Problem Relation Age of Onset  . Heart disease Mother 35  . Heart failure Mother 74  . Heart failure Father 56   History   Social History  . Marital Status: Married    Spouse Name: N/A    Number of Children: N/A  . Years of Education: N/A   Social History Main Topics  . Smoking status: Former Smoker -- 10 years    Types: Cigarettes    Quit date: 02/09/1974  . Smokeless tobacco: Former Neurosurgeon  . Alcohol Use: Yes     Comment: 2 beer day  . Drug Use: No  . Sexually Active:    Other Topics Concern  . None   Social History Narrative  . None   Past  Surgical History  Procedure Laterality Date  . 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 Right foot surgery  nov 2012  . Rigth shoulder surgery for bune spurs  nov 2011  . Hernia repair  1980's  . Tonsillectomy  age 27  . Wisdom teeth extarction  25-30 yrs ago  . Periodontal gum surgery  1980's  . Total hip arthroplasty  04/01/2012    Procedure: TOTAL HIP ARTHROPLASTY ANTERIOR APPROACH;  Surgeon: Kathryne Hitch, MD;  Location: [facility];  Service: Orthopedics;  Laterality: Left;  Left Total Hip Arthroplasty   Past Medical History  Diagnosis Date  . Hypercholesterolemia   . Osteoarthritis   . Tinnitus     both ears   BP 172/69  Pulse 82  Resp 14  Ht 5\' 11"  (1.803 m)  Wt 194 lb (87.998 kg)  BMI 27.07 kg/m2  SpO2 96%     Review of Systems  Musculoskeletal: Positive for back pain.       Right knee and foot pain  All other systems reviewed and are negative.       Objective:   Physical Exam  Nursing note and vitals reviewed. Constitutional: He is oriented to person, place, and time. He appears well-developed and well-nourished.  HENT:  Head: Normocephalic and atraumatic.  Eyes: Conjunctivae and EOM are normal. Pupils are equal,  round, and reactive to light.  Musculoskeletal:       Lumbar back: He exhibits decreased range of motion and pain. He exhibits no tenderness and no deformity.  Pain wit lumbar flexion, limited ext but no pain  Neurological: He is alert and oriented to person, place, and time. He has normal strength. He displays no atrophy. No sensory deficit. Gait normal.  Reflex Scores:      Tricep reflexes are 2+ on the right side and 3+ on the left side.      Bicep reflexes are 2+ on the right side and 3+ on the left side.      Brachioradialis reflexes are 2+ on the right side and 2+ on the left side.      Patellar reflexes are 2+ on the right side and 2+ on the left side.      Achilles reflexes are 2+ on the right side and 2+ on the left  side. Psychiatric: He has a normal mood and affect.   Negative straight leg raising       Assessment & Plan:  1.  Back pain as well as right foot pain. Normal neurologic examination. Negative straight leg raising. I do not feel these 2 symptoms are connected. His back pain may be related to spondylosis or chronic degenerative disc. His toe pain is most likely related to his osteoarthritis. He plans to followup with his orthopedic surgeon and would like to get some imaging studies. I will defer to his orthopedist for this.  In terms of his chronic pain he would like to try something other than narcotic analgesics. We discussed nonpharmacologic treatments such as using heat for the back as well as possibly trialing a TENS unit. I did not think a TENS unit would help with his toe pain. We also discussed a trial of tramadol 50-100 mg each bedtime in place of hydrocodone which he also takes just at night.  We also discussed the possibility of back injections. He is all ready had fluoroscopic guided hip injections by Dr. Alvester Morin and may continue at that office for the back injections should he elect to go that route.  Long discussion, we will see him back on a when necessary basis

## 2013-08-10 ENCOUNTER — Other Ambulatory Visit: Payer: Self-pay | Admitting: Cardiology

## 2013-08-10 DIAGNOSIS — G4700 Insomnia, unspecified: Secondary | ICD-10-CM

## 2013-08-10 DIAGNOSIS — F419 Anxiety disorder, unspecified: Secondary | ICD-10-CM

## 2013-08-10 MED ORDER — ALPRAZOLAM 0.25 MG PO TABS: 0.2500 mg | ORAL_TABLET | Freq: Two times a day (BID) | ORAL | Status: DC

## 2013-08-10 NOTE — Telephone Encounter (Signed)
Called in Xanax as requested. Will receive letter about appointment in Spring

## 2013-08-10 NOTE — Telephone Encounter (Signed)
New problems    Pt has questions about his meds and an appointment he may or may not need.

## 2013-08-17 ENCOUNTER — Other Ambulatory Visit: Payer: Self-pay | Admitting: Cardiology

## 2014-02-05 ENCOUNTER — Telehealth: Payer: Self-pay | Admitting: *Deleted

## 2014-02-05 DIAGNOSIS — F419 Anxiety disorder, unspecified: Secondary | ICD-10-CM

## 2014-02-05 MED ORDER — ALPRAZOLAM 0.25 MG PO TABS: 0.2500 mg | ORAL_TABLET | Freq: Two times a day (BID) | ORAL | Status: DC

## 2014-02-05 NOTE — Telephone Encounter (Signed)
[facility]. Thanks, MI

## 2014-02-07 ENCOUNTER — Other Ambulatory Visit: Payer: Self-pay | Admitting: Cardiology

## 2014-02-12 ENCOUNTER — Other Ambulatory Visit (INDEPENDENT_AMBULATORY_CARE_PROVIDER_SITE_OTHER): Payer: Medicare Other

## 2014-02-12 DIAGNOSIS — M1990 Unspecified osteoarthritis, unspecified site: Secondary | ICD-10-CM

## 2014-02-12 DIAGNOSIS — E7800 Pure hypercholesterolemia, unspecified: Secondary | ICD-10-CM

## 2014-02-12 LAB — CBC WITH DIFFERENTIAL/PLATELET
BASOS PCT: 0.7 % (ref 0.0–3.0)
Basophils Absolute: 0 10*3/uL (ref 0.0–0.1)
Eosinophils Absolute: 0.1 10*3/uL (ref 0.0–0.7)
Eosinophils Relative: 1.9 % (ref 0.0–5.0)
HCT: 45.5 % (ref 39.0–52.0)
HEMOGLOBIN: 15.4 g/dL (ref 13.0–17.0)
LYMPHS PCT: 32 % (ref 12.0–46.0)
Lymphs Abs: 2 10*3/uL (ref 0.7–4.0)
MCHC: 33.8 g/dL (ref 30.0–36.0)
MCV: 91.5 fl (ref 78.0–100.0)
MONOS PCT: 10.8 % (ref 3.0–12.0)
Monocytes Absolute: 0.7 10*3/uL (ref 0.1–1.0)
NEUTROS ABS: 3.4 10*3/uL (ref 1.4–7.7)
Neutrophils Relative %: 54.6 % (ref 43.0–77.0)
Platelets: 219 10*3/uL (ref 150.0–400.0)
RBC: 4.97 Mil/uL (ref 4.22–5.81)
RDW: 12.8 % (ref 11.5–14.6)
WBC: 6.3 10*3/uL (ref 4.5–10.5)

## 2014-02-12 LAB — BASIC METABOLIC PANEL
BUN: 14 mg/dL (ref 6–23)
CALCIUM: 8.7 mg/dL (ref 8.4–10.5)
CO2: 27 mEq/L (ref 19–32)
Chloride: 100 mEq/L (ref 96–112)
Creatinine, Ser: 1.1 mg/dL (ref 0.4–1.5)
GFR: 71.46 mL/min (ref >60.00)
Glucose, Bld: 88 mg/dL (ref 70–99)
Potassium: 3.9 mEq/L (ref 3.5–5.1)
SODIUM: 135 meq/L (ref 135–145)

## 2014-02-12 LAB — LIPID PANEL
CHOL/HDL RATIO: 3
Cholesterol: 122 mg/dL (ref 0–200)
HDL: 44.9 mg/dL (ref >39.00)
LDL Cholesterol: 63 mg/dL (ref 0–99)
TRIGLYCERIDES: 73 mg/dL (ref 0.0–149.0)
VLDL: 14.6 mg/dL (ref 0.0–40.0)

## 2014-02-12 LAB — HEPATIC FUNCTION PANEL
ALBUMIN: 3.9 g/dL (ref 3.5–5.2)
ALT: 19 U/L (ref 0–53)
AST: 27 U/L (ref 0–37)
Alkaline Phosphatase: 62 U/L (ref 39–117)
Bilirubin, Direct: 0.1 mg/dL (ref 0.0–0.3)
Total Bilirubin: 0.9 mg/dL (ref 0.3–1.2)
Total Protein: 6.6 g/dL (ref 6.0–8.3)

## 2014-02-12 NOTE — Progress Notes (Signed)
Quick Note:  Please make copy of labs for patient visit. ______ 

## 2014-02-13 ENCOUNTER — Encounter: Payer: Self-pay | Admitting: Cardiology

## 2014-02-13 ENCOUNTER — Ambulatory Visit (INDEPENDENT_AMBULATORY_CARE_PROVIDER_SITE_OTHER): Payer: Medicare Other | Admitting: Cardiology

## 2014-02-13 VITALS — BP 130/75 | HR 80 | Ht 73.0 in | Wt 201.0 lb

## 2014-02-13 DIAGNOSIS — M1990 Unspecified osteoarthritis, unspecified site: Secondary | ICD-10-CM

## 2014-02-13 DIAGNOSIS — G4700 Insomnia, unspecified: Secondary | ICD-10-CM

## 2014-02-13 DIAGNOSIS — E7800 Pure hypercholesterolemia, unspecified: Secondary | ICD-10-CM

## 2014-02-13 DIAGNOSIS — Z8249 Family history of ischemic heart disease and other diseases of the circulatory system: Secondary | ICD-10-CM

## 2014-02-13 NOTE — Progress Notes (Signed)
Matthew Collins Date of Birth:  22-Feb-1941 [address] [place], [place]  [id number] [phone]         Fax   [phone]  History of Present Illness: This pleasant 73 year old gentleman is seen for a one-year followup office visit. He has a history of hypercholesterolemia and is on simvastatin 40 mg daily. He also has a past history of osteoarthritis. He has had previous right shoulder surgery and right foot surgery. In June 2013 he had a left total hip replacement by Dr. Rayburn MaBlackmon.  He is very pleased with the results from his hip surgery and it has also helped his low back pain. Patient has a strong family history of ischemic heart disease. He himself however has not been experiencing any chest pain or shortness of breath. His blood pressure has been normal and he is not diabetic.  He has been having ongoing orthopedic issues regarding pain in his right foot.  He is seeing Dr. Lajoyce Cornersuda  for this.   Current Outpatient Prescriptions  Medication Sig Dispense Refill  . ALPRAZolam (XANAX) 0.25 MG tablet Take 1 tablet (0.25 mg total) by mouth 2 (two) times daily.  60 tablet  5  . Gabapentin, PHN, 300 MG TABS Take by mouth. 1 tab twice a day      . Multiple Vitamins-Minerals (MULTIVITAMIN PO) Take by mouth. istonic powder daily      . OVER THE COUNTER MEDICATION ISOTONIC OPC 3 POWDER Q DAY      . simvastatin (ZOCOR) 40 MG tablet TAKE 1 TABLET (40 MG TOTAL) BY MOUTH AT BEDTIME.  90 tablet  0  . tetrahydrozoline 0.05 % ophthalmic solution Place 1 drop into both eyes 2 (two) times daily as needed. Dry eyes      . tolnaftate (TINACTIN) 1 % cream Apply 1 application topically 2 (two) times daily as needed. Itching      . traMADol (ULTRAM) 50 MG tablet Take 1 tablet (50 mg total) by mouth at bedtime as needed and may repeat dose one time if needed for pain.  30 tablet  0   No current facility-administered medications for this visit.    No Known Allergies  Patient Active Problem List    Diagnosis Date Noted  . Back pain, chronic 03/27/2013  . Degenerative arthritis of hip 04/01/2012  . Hypercholesterolemia   . Osteoarthritis     History  Smoking status  . Former Smoker -- 10 years  . Types: Cigarettes  . Quit date: 02/09/1974  Smokeless tobacco  . Former NeurosurgeonUser    History  Alcohol Use  . Yes    Comment: 2 beer day    Family History  Problem Relation Age of Onset  . Heart disease Mother 5287  . Heart failure Mother 6787  . Heart failure Father 4389    Review of Systems: Constitutional: no fever chills diaphoresis or fatigue or change in weight.  Head and neck: no hearing loss, no epistaxis, no photophobia or visual disturbance. Respiratory: No cough, shortness of breath or wheezing. Cardiovascular: No chest pain peripheral edema, palpitations. Gastrointestinal: No abdominal distention, no abdominal pain, no change in bowel habits hematochezia or melena. Genitourinary: No dysuria, no frequency, no urgency, no nocturia. Musculoskeletal:No arthralgias, no back pain, no gait disturbance or myalgias.  Positive for osteoarthritis Neurological: No dizziness, no headaches, no numbness, no seizures, no syncope, no weakness, no tremors. Hematologic: No lymphadenopathy, no easy bruising. Psychiatric: No confusion, no hallucinations, no sleep disturbance.    Physical  Exam: Filed Vitals:   02/13/14 1325  BP: 130/75  Pulse: 80   the general appearance reveals a well-developed well-nourished gentleman in no distress.The head and neck exam reveals pupils equal and reactive.  Extraocular movements are full.  There is no scleral icterus.  The mouth and pharynx are normal.  The neck is supple.  The carotids reveal no bruits.  The jugular venous pressure is normal.  The  thyroid is not enlarged.  There is no lymphadenopathy.  The chest is clear to percussion and auscultation.  There are no rales or rhonchi.  Expansion of the chest is symmetrical.  The precordium is quiet.  The  first heart sound is normal.  The second heart sound is physiologically split.  There is no murmur gallop rub or click.  There is no abnormal lift or heave.  The abdomen is soft and nontender.  The bowel sounds are normal.  The liver and spleen are not enlarged.  There are no abdominal masses.  There are no abdominal bruits.  Extremities reveal good pedal pulses.  There is no phlebitis or edema.  There is no cyanosis or clubbing.  Strength is normal and symmetrical in all extremities.  There is no lateralizing weakness.  There are no sensory deficits.  The skin is warm and dry.  There is no rash.  His electrocardiogram today shows normal sinus rhythm and is within normal limits.   Assessment / Plan: Continue same medication.  His weight is up 6 pounds since last visit  and he will watch diet carefully. Recheck in one year for office visit EKG lipid panel hepatic function panel basal metabolic panel and CBC.

## 2014-02-13 NOTE — Assessment & Plan Note (Signed)
Patient has a history of osteoarthritis.  He has back pain.  He has a previous hip replacement.  He is now having pain in his right foot.  He is currently in the care of Dr. Lajoyce Cornersuda.

## 2014-02-13 NOTE — Assessment & Plan Note (Signed)
Patient has a history of hypercholesterolemia.  He is on simvastatin.  His lipids are satisfactory.  He has a history of premature coronary disease in his parents.  He himself goes to [facility] [employer] [facility] times a week as part of a silver sneakers program and exercises.  He has not been experiencing any chest discomfort to suggest angina pectoris.  He denies shortness of breath dizziness or syncope.

## 2014-02-13 NOTE — Patient Instructions (Signed)
Your physician recommends that you continue on your current medications as directed. Please refer to the Current Medication list given to you today.  Your physician wants you to follow-up in: 1 year ov/ekg/lp/bmet/hfp/cbc You will receive a reminder letter in the mail two months in advance. If you don't receive a letter, please call our office to schedule the follow-up appointment.

## 2014-05-15 ENCOUNTER — Other Ambulatory Visit: Payer: Self-pay | Admitting: Cardiology

## 2014-07-23 ENCOUNTER — Other Ambulatory Visit: Payer: Self-pay | Admitting: Cardiology

## 2014-07-23 DIAGNOSIS — F419 Anxiety disorder, unspecified: Secondary | ICD-10-CM

## 2014-07-23 NOTE — Telephone Encounter (Signed)
Refilled Xanax as requested  

## 2014-07-23 NOTE — Telephone Encounter (Signed)
Patient would like a call back today. Only wants to speak with [facility] regarding medication. Please call and advise.

## 2014-07-24 MED ORDER — ALPRAZOLAM 0.25 MG PO TABS: 0.2500 mg | ORAL_TABLET | Freq: Two times a day (BID) | ORAL | Status: DC | PRN

## 2014-08-13 ENCOUNTER — Other Ambulatory Visit: Payer: Self-pay | Admitting: Cardiology

## 2014-11-12 ENCOUNTER — Other Ambulatory Visit: Payer: Self-pay | Admitting: *Deleted

## 2014-11-12 MED ORDER — SIMVASTATIN 40 MG PO TABS: ORAL_TABLET | ORAL | Status: DC

## 2015-01-02 ENCOUNTER — Other Ambulatory Visit: Payer: Self-pay | Admitting: Cardiology

## 2015-01-02 DIAGNOSIS — F419 Anxiety disorder, unspecified: Secondary | ICD-10-CM

## 2015-02-11 ENCOUNTER — Other Ambulatory Visit (INDEPENDENT_AMBULATORY_CARE_PROVIDER_SITE_OTHER): Payer: Medicare Other | Admitting: *Deleted

## 2015-02-11 DIAGNOSIS — G4700 Insomnia, unspecified: Secondary | ICD-10-CM

## 2015-02-11 DIAGNOSIS — M1990 Unspecified osteoarthritis, unspecified site: Secondary | ICD-10-CM | POA: Diagnosis not present

## 2015-02-11 DIAGNOSIS — E7800 Pure hypercholesterolemia, unspecified: Secondary | ICD-10-CM

## 2015-02-11 LAB — CBC WITH DIFFERENTIAL/PLATELET
BASOS ABS: 0 10*3/uL (ref 0.0–0.1)
BASOS PCT: 0.4 % (ref 0.0–3.0)
EOS ABS: 0.1 10*3/uL (ref 0.0–0.7)
Eosinophils Relative: 2.8 % (ref 0.0–5.0)
HCT: 33.9 % — ABNORMAL LOW (ref 39.0–52.0)
HEMOGLOBIN: 10.9 g/dL — AB (ref 13.0–17.0)
LYMPHS ABS: 1.3 10*3/uL (ref 0.7–4.0)
Lymphocytes Relative: 27.5 % (ref 12.0–46.0)
MCHC: 32.1 g/dL (ref 30.0–36.0)
MCV: 77.5 fl — AB (ref 78.0–100.0)
MONO ABS: 0.5 10*3/uL (ref 0.1–1.0)
Monocytes Relative: 10.5 % (ref 3.0–12.0)
Neutro Abs: 2.8 10*3/uL (ref 1.4–7.7)
Neutrophils Relative %: 58.8 % (ref 43.0–77.0)
Platelets: 234 10*3/uL (ref 150.0–400.0)
RBC: 4.38 Mil/uL (ref 4.22–5.81)
RDW: 17.7 % — AB (ref 11.5–15.5)
WBC: 4.7 10*3/uL (ref 4.0–10.5)

## 2015-02-11 LAB — BASIC METABOLIC PANEL
BUN: 15 mg/dL (ref 6–23)
CALCIUM: 8.6 mg/dL (ref 8.4–10.5)
CO2: 27 mEq/L (ref 19–32)
Chloride: 101 mEq/L (ref 96–112)
Creatinine, Ser: 1.11 mg/dL (ref 0.40–1.50)
GFR: 69.05 mL/min (ref >60.00)
GLUCOSE: 84 mg/dL (ref 70–99)
Potassium: 3.9 mEq/L (ref 3.5–5.1)
Sodium: 133 mEq/L — ABNORMAL LOW (ref 135–145)

## 2015-02-11 LAB — LIPID PANEL
CHOL/HDL RATIO: 2
Cholesterol: 122 mg/dL (ref 0–200)
HDL: 50.8 mg/dL (ref >39.00)
LDL Cholesterol: 57 mg/dL (ref 0–99)
NONHDL: 71.2
Triglycerides: 70 mg/dL (ref 0.0–149.0)
VLDL: 14 mg/dL (ref 0.0–40.0)

## 2015-02-11 LAB — HEPATIC FUNCTION PANEL
ALT: 14 U/L (ref 0–53)
AST: 23 U/L (ref 0–37)
Albumin: 3.9 g/dL (ref 3.5–5.2)
Alkaline Phosphatase: 66 U/L (ref 39–117)
BILIRUBIN DIRECT: 0.1 mg/dL (ref 0.0–0.3)
BILIRUBIN TOTAL: 0.5 mg/dL (ref 0.2–1.2)
Total Protein: 6.3 g/dL (ref 6.0–8.3)

## 2015-02-11 NOTE — Progress Notes (Signed)
Quick Note:  Please make copy of labs for patient visit. ______ 

## 2015-02-13 ENCOUNTER — Ambulatory Visit (INDEPENDENT_AMBULATORY_CARE_PROVIDER_SITE_OTHER): Payer: Medicare Other | Admitting: Cardiology

## 2015-02-13 ENCOUNTER — Telehealth: Payer: Self-pay | Admitting: *Deleted

## 2015-02-13 ENCOUNTER — Encounter: Payer: Self-pay | Admitting: Cardiology

## 2015-02-13 VITALS — BP 140/80 | HR 81 | Ht 73.0 in | Wt 205.0 lb

## 2015-02-13 DIAGNOSIS — G4700 Insomnia, unspecified: Secondary | ICD-10-CM | POA: Diagnosis not present

## 2015-02-13 DIAGNOSIS — M159 Polyosteoarthritis, unspecified: Secondary | ICD-10-CM

## 2015-02-13 DIAGNOSIS — M150 Primary generalized (osteo)arthritis: Secondary | ICD-10-CM

## 2015-02-13 DIAGNOSIS — E7800 Pure hypercholesterolemia, unspecified: Secondary | ICD-10-CM

## 2015-02-13 DIAGNOSIS — D509 Iron deficiency anemia, unspecified: Secondary | ICD-10-CM

## 2015-02-13 LAB — CBC WITH DIFFERENTIAL/PLATELET
Basophils Absolute: 0 10*3/uL (ref 0.0–0.1)
Basophils Relative: 0.6 % (ref 0.0–3.0)
EOS PCT: 1.6 % (ref 0.0–5.0)
Eosinophils Absolute: 0.1 10*3/uL (ref 0.0–0.7)
HEMATOCRIT: 32.2 % — AB (ref 39.0–52.0)
HEMOGLOBIN: 10.5 g/dL — AB (ref 13.0–17.0)
LYMPHS ABS: 1.6 10*3/uL (ref 0.7–4.0)
Lymphocytes Relative: 24.8 % (ref 12.0–46.0)
MCHC: 32.5 g/dL (ref 30.0–36.0)
MCV: 76.7 fl — AB (ref 78.0–100.0)
MONO ABS: 0.5 10*3/uL (ref 0.1–1.0)
MONOS PCT: 8.1 % (ref 3.0–12.0)
NEUTROS ABS: 4.2 10*3/uL (ref 1.4–7.7)
Neutrophils Relative %: 64.9 % (ref 43.0–77.0)
Platelets: 240 10*3/uL (ref 150.0–400.0)
RBC: 4.2 Mil/uL — ABNORMAL LOW (ref 4.22–5.81)
RDW: 17.8 % — ABNORMAL HIGH (ref 11.5–15.5)
WBC: 6.5 10*3/uL (ref 4.0–10.5)

## 2015-02-13 NOTE — Progress Notes (Signed)
Cardiology Office Note   Date:  02/13/2015   ID:  Matthew Collins, DOB 1941/06/05, MRN [id number]  PCP:  Matthew Pounds, MD  Cardiologist:   Matthew Clement, MD   No chief complaint on file.     History of Present Illness: Matthew Collins is a 74 y.o. male who presents for a one-year follow-up office visit  This pleasant 74 year old gentleman is seen for a one-year followup office visit. He has a history of hypercholesterolemia and is on simvastatin 40 mg daily. He also has a past history of osteoarthritis. He has had previous right shoulder surgery and right foot surgery. In June 2013 he had a left total hip replacement by Dr. Rayburn Collins. He is very pleased with the results from his hip surgery and it has also helped his low back pain.  The patient has had some low back pain.  Matthew Collins has done some injections in the back which have been helpful.   Patient has a strong family history of ischemic heart disease. He himself however has not been experiencing any chest pain or shortness of breath. His blood pressure has been normal and he is not diabetic. He has been having ongoing orthopedic issues regarding pain in his right foot. He is seeing Dr. Lajoyce Collins for this.  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 He has not been aware of any change in his bowel movements.  He denies hematochezia or melena.  His last colonoscopy was about 5 years ago and was unremarkable.  Not been having any hematuria.  He has nocturia 1. His exercise tolerance remains good.  He and his wife go to the gym on a regular basis and he does a lot of nautilus exercises and gets a good workout without undue symptoms.  He has not been expressing any chest pain or shortness of breath.  Appetite is good and his weight is up 4 Collins since last visit.  Past Medical History  Diagnosis Date  . Hypercholesterolemia   . Osteoarthritis   . Tinnitus     both ears    Past Surgical  History  Procedure Laterality Date  . 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 Right foot surgery  nov 2012  . Rigth shoulder surgery for bune spurs  nov 2011  . Hernia repair  1980's  . Tonsillectomy  age 50  . Wisdom teeth extarction  25-30 yrs ago  . Periodontal gum surgery  1980's  . Total hip arthroplasty  04/01/2012    Procedure: TOTAL HIP ARTHROPLASTY ANTERIOR APPROACH;  Surgeon: Matthew Hitch, MD;  Location: [facility];  Service: Orthopedics;  Laterality: Left;  Left Total Hip Arthroplasty     Current Outpatient Prescriptions  Medication Sig Dispense Refill  . ALPRAZolam (XANAX) 0.25 MG tablet TAKE 1 TABLET BY MOUTH TWICE DAILY AS NEEDED 60 tablet 5  . Gabapentin, PHN, 300 MG TABS Take by mouth. Take five tablets once each day.    . Multiple Vitamins-Minerals (MULTIVITAMIN PO) Take by mouth. istonic powder daily    . OVER THE COUNTER MEDICATION ISOTONIC OPC 3 POWDER Q DAY    . simvastatin (ZOCOR) 40 MG tablet TAKE 1 TABLET (40 MG TOTAL) BY MOUTH AT BEDTIME. 90 tablet 2  . tetrahydrozoline 0.05 % ophthalmic solution Place 1 drop into both eyes 2 (two) times daily as needed. Dry eyes    . traMADol (ULTRAM) 50 MG tablet Take 1 tablet (  50 mg total) by mouth at bedtime as needed and may repeat dose one time if needed for pain. 30 tablet 0   No current facility-administered medications for this visit.    Allergies:   Review of patient's allergies indicates no known allergies.    Social History:  The patient  reports that he quit smoking about 41 years ago. His smoking use included Cigarettes. He quit after 10 years of use. He has quit using smokeless tobacco. He reports that he drinks alcohol. He reports that he does not use illicit drugs.   Family History:  The patient's family history includes Heart disease (age of onset: 1087) in his mother; Heart failure (age of onset: 3587) in his mother; Heart failure (age of onset: 2989) in his father.    ROS:  Please see the history  of present illness.   Otherwise, review of systems are positive for none.   All other systems are reviewed and negative.    PHYSICAL EXAM: VS:  BP 140/80 mmHg  Pulse 81  Ht 6\' 1"  (1.854 m)  Wt 205 lb (92.987 kg)  BMI 27.05 kg/m2 , BMI Body mass index is 27.05 kg/(m^2). GEN: Well nourished, well developed, in no acute distress HEENT: normal Neck: no JVD, carotid bruits, or masses Cardiac: RRR; no murmurs, rubs, or gallops,no edema  Respiratory:  clear to auscultation bilaterally, normal work of breathing GI: soft, nontender, nondistended, + BS MS: no deformity or atrophy Skin: warm and dry, no rash Neuro:  Strength and sensation are intact Psych: euthymic mood, full affect   EKG:  EKG is ordered today. The ekg ordered today demonstrates normal sinus rhythm.  Within normal limits.   Recent Labs: 02/11/2015: ALT 14; BUN 15; Creatinine 1.11; Hemoglobin 10.9*; Platelets 234.0; Potassium 3.9; Sodium 133*    Lipid Panel    Component Value Date/Time   CHOL 122 02/11/2015 0908   TRIG 70.0 02/11/2015 0908   HDL 50.80 02/11/2015 0908   CHOLHDL 2 02/11/2015 0908   VLDL 14.0 02/11/2015 0908   LDLCALC 57 02/11/2015 0908      Wt Readings from Last 3 Encounters:  02/13/15 205 lb (92.987 kg)  02/13/14 201 lb (91.173 kg)  03/27/13 194 lb (87.998 kg)      Other studies Reviewed: Additional studies/ records that were reviewed today include: . Review of the above records demonstrates:    ASSESSMENT AND PLAN:  1.  Hypercholesterolemia 2.  Osteoarthritis 3.  Lab work from4/18/16 shows an unexpected hypochromic microcytic anemia with hemoglobin of 10.9 and an MCV of 77.5.  There is no history to suggest blood loss.  First we will repeat the CBC to be sure that it is not a lab error.  If he indeed has a new anemia, we will refer him back to Dr. Timothy Collins for further evaluation.  Current medicines are reviewed at length with the patient today.  The patient does not have concerns regarding  medicines.  The following changes have been made:  no change  Labs/ tests ordered today include:   Orders Placed This Encounter  Procedures  . CBC with Differential/Platelet  . Lipid panel  . Hepatic function panel  . Basic metabolic panel  . CBC with Differential/Platelet  . EKG 12-Lead    Disposition: Recheck CBC today. Plan to see him back here in one year for office visit and lab work and EKG  Signed, Matthew Clementhomas Cindie Rajagopalan, MD  02/13/2015 5:06 PM    [facility] [address], [place], [place]  [address] Phone: [phone]; Fax: [phone]

## 2015-02-13 NOTE — Patient Instructions (Addendum)
Medication Instructions:  Your physician recommends that you continue on your current medications as directed. Please refer to the Current Medication list given to you today.  Labwork: cbc  Testing/Procedures: none  Follow-Up: Your physician wants you to follow-up in: 1 year with fasting labs  You will receive a reminder letter in the mail two months in advance. If you don't receive a letter, please call our office to schedule the follow-up appointment.

## 2015-02-13 NOTE — Telephone Encounter (Signed)
-----   Message from Cassell Clementhomas Brackbill, MD sent at 02/13/2015  5:26 PM EDT ----- Please report.  The repeat hemoglobin confirmed the original finding of microcytic anemia.  Hemoglobin today was 10.5 down slightly from 2 days ago.  Please send copy of labs to Dr. Timothy Lassousso.  Please advise patient to follow-up quickly with Dr. Timothy Lassousso concerning this new finding of microcytic anemia.  I sent a copy of today's office visit to Dr. Timothy Lassousso

## 2015-02-13 NOTE — Telephone Encounter (Signed)
   Notes Recorded by Burnell BlanksMelinda B Pratt on 02/13/2015 at 5:29 PM Advised patient of lab results and will forward to Dr Timothy Lassousso

## 2015-02-27 ENCOUNTER — Other Ambulatory Visit (HOSPITAL_COMMUNITY): Payer: Medicare Other

## 2015-04-25 ENCOUNTER — Telehealth: Payer: Self-pay | Admitting: Cardiology

## 2015-04-25 NOTE — Telephone Encounter (Signed)
New problem ° ° ° °Pt has a question he need to ask you. Please call pt  °

## 2015-04-25 NOTE — Telephone Encounter (Signed)
F/u    Pt stated he found the information he need and you don't have to call him back.

## 2015-04-25 NOTE — Telephone Encounter (Signed)
Left message to call back  

## 2015-04-25 NOTE — Telephone Encounter (Signed)
Noted  

## 2015-05-13 ENCOUNTER — Other Ambulatory Visit: Payer: Self-pay | Admitting: *Deleted

## 2015-05-13 MED ORDER — SIMVASTATIN 40 MG PO TABS: ORAL_TABLET | ORAL | Status: AC

## 2015-07-16 ENCOUNTER — Other Ambulatory Visit: Payer: Self-pay | Admitting: Cardiology

## 2015-07-16 DIAGNOSIS — F419 Anxiety disorder, unspecified: Secondary | ICD-10-CM

## 2015-07-16 NOTE — Telephone Encounter (Signed)
Pt is requesting an refill on alprazolam 0.25 mg, please advise

## 2015-07-17 ENCOUNTER — Other Ambulatory Visit: Payer: Self-pay | Admitting: Cardiology

## 2015-07-22 NOTE — Telephone Encounter (Signed)
Okay to refill? 

## 2015-10-09 ENCOUNTER — Other Ambulatory Visit: Payer: Self-pay | Admitting: *Deleted

## 2015-10-09 DIAGNOSIS — F419 Anxiety disorder, unspecified: Secondary | ICD-10-CM

## 2015-10-09 MED ORDER — ALPRAZOLAM 0.25 MG PO TABS: 0.2500 mg | ORAL_TABLET | Freq: Two times a day (BID) | ORAL | Status: AC | PRN

## 2015-10-09 NOTE — Telephone Encounter (Signed)
Spoke with patient and he will get further refills of Xanax from PCP after  Dr. Bernita BuffyBrackbill retires in March

## 2015-11-29 ENCOUNTER — Ambulatory Visit (INDEPENDENT_AMBULATORY_CARE_PROVIDER_SITE_OTHER): Payer: Medicare Other | Admitting: Cardiology

## 2015-11-29 VITALS — BP 148/80 | HR 78 | Ht 73.0 in | Wt 199.8 lb

## 2015-11-29 DIAGNOSIS — M150 Primary generalized (osteo)arthritis: Secondary | ICD-10-CM | POA: Diagnosis not present

## 2015-11-29 DIAGNOSIS — Z8249 Family history of ischemic heart disease and other diseases of the circulatory system: Secondary | ICD-10-CM | POA: Diagnosis not present

## 2015-11-29 DIAGNOSIS — M159 Polyosteoarthritis, unspecified: Secondary | ICD-10-CM

## 2015-11-29 DIAGNOSIS — E7800 Pure hypercholesterolemia, unspecified: Secondary | ICD-10-CM

## 2015-11-29 NOTE — Progress Notes (Signed)
Cardiology Office Note   Date:  11/29/2015   ID:  Matthew Collins, DOB Aug 05, 1941, MRN [id number]  PCP:  Gwen Pounds, MD  Cardiologist: Cassell Clement MD  No chief complaint on file.     History of Present Illness: Matthew Collins is a 75 y.o. male who presents for a one-year follow-up visit   He has a history of hypercholesterolemia and is on simvastatin 40 mg daily. He also has a past history of osteoarthritis. He has had previous right shoulder surgery and right foot surgery. In June 2013 he had a left total hip replacement by Dr. Rayburn Ma. He is very pleased with the results from his hip surgery and it has also helped his low back pain. The patient has had some low back pain. Dr. Alvester Morin has done some injections in the back which have been helpful.  Patient has a strong family history of ischemic heart disease. He himself however has not been experiencing any chest pain or shortness of breath. His blood pressure has been normal and he is not diabetic. He has been having ongoing orthopedic issues regarding pain in his right foot. He is seeing Dr. Lajoyce Corners for this. 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 We saw him last year his hemoglobin was low.  He had a subsequent workup by Dr. Matthias Hughs showed a hiatal hernia.  There was no active bleeding according to the patient.  The patient states that his hemoglobin has subsequently returned to normal. His exercise tolerance remains good. The patient's wife has bronchiectasis and is on home oxygen.  This is limited their ability to travel as much.   Past Medical History  Diagnosis Date  . Hypercholesterolemia   . Osteoarthritis   . Tinnitus     both ears    Past Surgical History  Procedure Laterality Date  . 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 Right foot surgery  nov 2012  . Rigth shoulder surgery for bune spurs  nov 2011  . Hernia repair  1980's  . Tonsillectomy   age 71  . Wisdom teeth extarction  25-30 yrs ago  . Periodontal gum surgery  1980's  . Total hip arthroplasty  04/01/2012    Procedure: TOTAL HIP ARTHROPLASTY ANTERIOR APPROACH;  Surgeon: Kathryne Hitch, MD;  Location: [facility];  Service: Orthopedics;  Laterality: Left;  Left Total Hip Arthroplasty     Current Outpatient Prescriptions  Medication Sig Dispense Refill  . ALPRAZolam (XANAX) 0.25 MG tablet Take 1 tablet (0.25 mg total) by mouth 2 (two) times daily as needed. 60 tablet 3  . Gabapentin, PHN, 300 MG TABS Take by mouth. Take five tablets once each day.    . Multiple Vitamins-Minerals (MULTIVITAMIN PO) Take by mouth. istonic powder daily    . OVER THE COUNTER MEDICATION ISOTONIC OPC 3 POWDER Q DAY    . simvastatin (ZOCOR) 40 MG tablet TAKE 1 TABLET (40 MG TOTAL) BY MOUTH AT BEDTIME. 90 tablet 2  . tetrahydrozoline 0.05 % ophthalmic solution Place 1 drop into both eyes 2 (two) times daily as needed. Dry eyes    . traMADol (ULTRAM) 50 MG tablet Take 1 tablet (50 mg total) by mouth at bedtime as needed and may repeat dose one time if needed for pain. 30 tablet 0   No current facility-administered medications for this visit.    Allergies:   Review of patient's allergies indicates no known allergies.  Social History:  The patient  reports that he quit smoking about 41 years ago. His smoking use included Cigarettes. He quit after 10 years of use. He has quit using smokeless tobacco. He reports that he drinks alcohol. He reports that he does not use illicit drugs.   Family History:  The patient's family history includes Heart disease (age of onset: 41) in his mother; Heart failure (age of onset: 85) in his mother; Heart failure (age of onset: 85) in his father.    ROS:  Please see the history of present illness.   Otherwise, review of systems are positive for none.   All other systems are reviewed and negative.    PHYSICAL EXAM: VS:  BP 148/80 mmHg  Pulse 78  Ht  (1.854  m)  Wt 199 lb 12.8 oz (90.629 kg)  BMI 26.37 kg/m2 , BMI Body mass index is 26.37 kg/(m^2). GEN: Well nourished, well developed, in no acute distress HEENT: normal Neck: no JVD, carotid bruits, or masses Cardiac: RRR; no murmurs, rubs, or gallops,no edema  Respiratory:  clear to auscultation bilaterally, normal work of breathing GI: soft, nontender, nondistended, + BS MS: no deformity or atrophy Skin: warm and dry, no rash Neuro:  Strength and sensation are intact Psych: euthymic mood, full affect   EKG:  EKG is ordered today. The ekg ordered today demonstrates normal sinus rhythm at 78 bpm.  Within normal limits   Recent Labs: 02/11/2015: ALT 14; BUN 15; Creatinine, Ser 1.11; Potassium 3.9; Sodium 133* 02/13/2015: Hemoglobin 10.5*; Platelets 240.0    Lipid Panel    Component Value Date/Time   CHOL 122 02/11/2015 0908   TRIG 70.0 02/11/2015 0908   HDL 50.80 02/11/2015 0908   CHOLHDL 2 02/11/2015 0908   VLDL 14.0 02/11/2015 0908   LDLCALC 57 02/11/2015 0908      Wt Readings from Last 3 Encounters:  11/29/15 199 lb 12.8 oz (90.629 kg)  02/13/15 205 lb (92.987 kg)  02/13/14 201 lb (91.173 kg)        ASSESSMENT AND PLAN:  1. Hypercholesterolemia 2. Osteoarthritis 3. Hiatal hernia, asymptomatic   Current medicines are reviewed at length with the patient today.  The patient does not have concerns regarding medicines.  The following changes have been made:  no change  Labs/ tests ordered today include:   Orders Placed This Encounter  Procedures  . EKG 12-Lead     Disposition: The patient declined any lab work today.  He will be getting lab work soon with Dr. Timothy Lasso.  Return here when necessary  Signed, Cassell Clement MD 11/29/2015 5:11 PM    [facility] [address] [place], [place], [place]  [address] Phone: [phone]; Fax: [phone]

## 2015-11-29 NOTE — Patient Instructions (Signed)
Medication Instructions:  Your physician recommends that you continue on your current medications as directed. Please refer to the Current Medication list given to you today.  Labwork: none  Testing/Procedures: none  Follow-Up: As needed   If you need a refill on your cardiac medications before your next appointment, please call your pharmacy.  

## 2019-03-30 ENCOUNTER — Inpatient Hospital Stay (HOSPITAL_COMMUNITY): Payer: Medicare Other

## 2019-03-30 ENCOUNTER — Other Ambulatory Visit: Payer: Self-pay

## 2019-03-30 ENCOUNTER — Encounter (HOSPITAL_COMMUNITY): Payer: Self-pay | Admitting: Student

## 2019-03-30 ENCOUNTER — Emergency Department (HOSPITAL_COMMUNITY): Payer: Medicare Other

## 2019-03-30 ENCOUNTER — Inpatient Hospital Stay (HOSPITAL_COMMUNITY)
Admission: EM | Admit: 2019-03-30 | Discharge: 2019-04-01 | DRG: 378 | Disposition: A | Discharge status: Home / Self Care | Payer: Medicare Other | Attending: Internal Medicine | Admitting: Internal Medicine

## 2019-03-30 DIAGNOSIS — Z79899 Other long term (current) drug therapy: Secondary | ICD-10-CM

## 2019-03-30 DIAGNOSIS — I951 Orthostatic hypotension: Secondary | ICD-10-CM

## 2019-03-30 DIAGNOSIS — E861 Hypovolemia: Secondary | ICD-10-CM | POA: Diagnosis present

## 2019-03-30 DIAGNOSIS — K5731 Diverticulosis of large intestine without perforation or abscess with bleeding: Secondary | ICD-10-CM | POA: Diagnosis present

## 2019-03-30 DIAGNOSIS — M1990 Unspecified osteoarthritis, unspecified site: Secondary | ICD-10-CM | POA: Diagnosis present

## 2019-03-30 DIAGNOSIS — D62 Acute posthemorrhagic anemia: Secondary | ICD-10-CM | POA: Diagnosis present

## 2019-03-30 DIAGNOSIS — Z1159 Encounter for screening for other viral diseases: Secondary | ICD-10-CM | POA: Diagnosis not present

## 2019-03-30 DIAGNOSIS — E7800 Pure hypercholesterolemia, unspecified: Secondary | ICD-10-CM | POA: Diagnosis present

## 2019-03-30 DIAGNOSIS — K922 Gastrointestinal hemorrhage, unspecified: Secondary | ICD-10-CM | POA: Diagnosis not present

## 2019-03-30 DIAGNOSIS — D509 Iron deficiency anemia, unspecified: Secondary | ICD-10-CM | POA: Diagnosis present

## 2019-03-30 DIAGNOSIS — Z87891 Personal history of nicotine dependence: Secondary | ICD-10-CM

## 2019-03-30 DIAGNOSIS — Z96642 Presence of left artificial hip joint: Secondary | ICD-10-CM | POA: Diagnosis present

## 2019-03-30 DIAGNOSIS — K5791 Diverticulosis of intestine, part unspecified, without perforation or abscess with bleeding: Secondary | ICD-10-CM | POA: Diagnosis not present

## 2019-03-30 LAB — CBC WITH DIFFERENTIAL/PLATELET
Abs Immature Granulocytes: 0.04 10*3/uL (ref 0.00–0.07)
Basophils Absolute: 0 10*3/uL (ref 0.0–0.1)
Basophils Relative: 0 %
Eosinophils Absolute: 0 10*3/uL (ref 0.0–0.5)
Eosinophils Relative: 0 %
HCT: 30.1 % — ABNORMAL LOW (ref 39.0–52.0)
Hemoglobin: 10.1 g/dL — ABNORMAL LOW (ref 13.0–17.0)
Immature Granulocytes: 0 %
Lymphocytes Relative: 11 %
Lymphs Abs: 1 10*3/uL (ref 0.7–4.0)
MCH: 32.1 pg (ref 26.0–34.0)
MCHC: 33.6 g/dL (ref 30.0–36.0)
MCV: 95.6 fL (ref 80.0–100.0)
Monocytes Absolute: 0.4 10*3/uL (ref 0.1–1.0)
Monocytes Relative: 4 %
Neutro Abs: 7.6 10*3/uL (ref 1.7–7.7)
Neutrophils Relative %: 85 %
Platelets: 241 10*3/uL (ref 150–400)
RBC: 3.15 MIL/uL — ABNORMAL LOW (ref 4.22–5.81)
RDW: 12.6 % (ref 11.5–15.5)
WBC: 9 10*3/uL (ref 4.0–10.5)
nRBC: 0 % (ref 0.0–0.2)

## 2019-03-30 LAB — SARS CORONAVIRUS 2 BY RT PCR (HOSPITAL ORDER, PERFORMED IN ~~LOC~~ HOSPITAL LAB): SARS Coronavirus 2: NEGATIVE

## 2019-03-30 LAB — COMPREHENSIVE METABOLIC PANEL
ALT: 21 U/L (ref 0–44)
AST: 29 U/L (ref 15–41)
Albumin: 3.8 g/dL (ref 3.5–5.0)
Alkaline Phosphatase: 51 U/L (ref 38–126)
Anion gap: 9 (ref 5–15)
BUN: 20 mg/dL (ref 8–23)
CO2: 23 mmol/L (ref 22–32)
Calcium: 8.1 mg/dL — ABNORMAL LOW (ref 8.9–10.3)
Chloride: 102 mmol/L (ref 98–111)
Creatinine, Ser: 0.94 mg/dL (ref 0.61–1.24)
GFR calc Af Amer: >60 mL/min (ref >60)
GFR calc non Af Amer: >60 mL/min (ref >60)
Glucose, Bld: 130 mg/dL — ABNORMAL HIGH (ref 70–99)
Potassium: 4 mmol/L (ref 3.5–5.1)
Sodium: 134 mmol/L — ABNORMAL LOW (ref 135–145)
Total Bilirubin: 0.6 mg/dL (ref 0.3–1.2)
Total Protein: 6.3 g/dL — ABNORMAL LOW (ref 6.5–8.1)

## 2019-03-30 LAB — TYPE AND SCREEN
ABO/RH(D): A POS
Antibody Screen: NEGATIVE

## 2019-03-30 LAB — POC OCCULT BLOOD, ED: Fecal Occult Bld: POSITIVE — AB

## 2019-03-30 LAB — HEMOGLOBIN AND HEMATOCRIT, BLOOD
HCT: 27.6 % — ABNORMAL LOW (ref 39.0–52.0)
Hemoglobin: 9.1 g/dL — ABNORMAL LOW (ref 13.0–17.0)

## 2019-03-30 MED ORDER — SODIUM CHLORIDE (PF) 0.9 % IJ SOLN
INTRAMUSCULAR | Status: AC
  Administered 2019-03-30: 18:00:00
  Filled 2019-03-30: qty 50

## 2019-03-30 MED ORDER — IOHEXOL 300 MG/ML  SOLN
100.0000 mL | Freq: Once | INTRAMUSCULAR | Status: AC | PRN
  Administered 2019-03-30: 100 mL via INTRAVENOUS

## 2019-03-30 MED ORDER — ACETAMINOPHEN 650 MG RE SUPP: 650.0000 mg | Freq: Four times a day (QID) | RECTAL | Status: DC | PRN

## 2019-03-30 MED ORDER — ALPRAZOLAM 0.25 MG PO TABS
0.2500 mg | ORAL_TABLET | Freq: Every evening | ORAL | Status: DC | PRN
  Administered 2019-03-30 – 2019-03-31 (×2): 0.25 mg via ORAL
  Filled 2019-03-30 (×2): qty 1

## 2019-03-30 MED ORDER — ONDANSETRON HCL 4 MG PO TABS: 4.0000 mg | ORAL_TABLET | Freq: Four times a day (QID) | ORAL | Status: DC | PRN

## 2019-03-30 MED ORDER — HYDROCODONE-ACETAMINOPHEN 5-325 MG PO TABS: 1.0000 | ORAL_TABLET | ORAL | Status: DC | PRN

## 2019-03-30 MED ORDER — GABAPENTIN 300 MG PO CAPS
300.0000 mg | ORAL_CAPSULE | Freq: Every day | ORAL | Status: DC
  Administered 2019-03-30 – 2019-04-01 (×8): 300 mg via ORAL
  Filled 2019-03-30 (×8): qty 1

## 2019-03-30 MED ORDER — ACETAMINOPHEN 325 MG PO TABS: 650.0000 mg | ORAL_TABLET | Freq: Four times a day (QID) | ORAL | Status: DC | PRN

## 2019-03-30 MED ORDER — NAPHAZOLINE-GLYCERIN 0.012-0.2 % OP SOLN
1.0000 [drp] | Freq: Two times a day (BID) | OPHTHALMIC | Status: DC | PRN
  Administered 2019-03-30: 1 [drp] via OPHTHALMIC
  Filled 2019-03-30: qty 15

## 2019-03-30 MED ORDER — SODIUM CHLORIDE 0.9 % IV SOLN
INTRAVENOUS | Status: DC
  Administered 2019-03-30: 13:00:00 via INTRAVENOUS

## 2019-03-30 MED ORDER — SODIUM CHLORIDE 0.9 % IV BOLUS
500.0000 mL | Freq: Once | INTRAVENOUS | Status: AC
  Administered 2019-03-30: 500 mL via INTRAVENOUS

## 2019-03-30 MED ORDER — PANTOPRAZOLE SODIUM 40 MG PO TBEC
40.0000 mg | DELAYED_RELEASE_TABLET | Freq: Every day | ORAL | Status: DC
  Administered 2019-03-31 – 2019-04-01 (×2): 40 mg via ORAL
  Filled 2019-03-30 (×2): qty 1

## 2019-03-30 MED ORDER — TRAMADOL HCL 50 MG PO TABS
50.0000 mg | ORAL_TABLET | Freq: Four times a day (QID) | ORAL | Status: DC | PRN
  Administered 2019-03-30 – 2019-03-31 (×5): 50 mg via ORAL
  Filled 2019-03-30 (×6): qty 1

## 2019-03-30 MED ORDER — SODIUM CHLORIDE 0.9 % IV SOLN: INTRAVENOUS | Status: DC

## 2019-03-30 MED ORDER — ONDANSETRON HCL 4 MG/2ML IJ SOLN: 4.0000 mg | Freq: Four times a day (QID) | INTRAMUSCULAR | Status: DC | PRN

## 2019-03-30 NOTE — ED Notes (Signed)
EDP at bedside  

## 2019-03-30 NOTE — Plan of Care (Signed)

## 2019-03-30 NOTE — H&P (Signed)
History and Physical        Hospital Admission Note Date: 03/30/2019  Patient name: Matthew Collins Medical record number: [id number] Date of birth: November 27, 1940 Age: 78 y.o. Gender: male  PCP: Creola Cornusso, John, MD    Patient coming from: Home  I [facility].    Chief Complaint:  Rectal bleeding, intermittent episodes in the last 4 days  HPI: Patient is a 78 year old male with history of hyperlipidemia, chronic anemia presented to ED with bloody diarrhea that started 4 days ago on Sunday.  Patient reported that he had lots of kale, almonds in the salad on the weekend, then rectal bleeding started the next day.  On the day of onset on Sunday he had 5 episodes of bloody diarrhea, described as bright light blood, no fevers or chills, no abdominal pain, no nausea or vomiting which spontaneously then resolved.  Next today, he took Imodium and the bloody diarrhea returned.  Patient reports that he had 3 episodes since yesterday evening after dinner and 1 this morning with bright red blood.  Patient feels dizzy and lightheaded, no chest pain or shortness of breath.  No abdominal pain.  No nausea or vomiting, hematemesis.  Denied taking any NSAIDs or blood thinners.  No prior history of GI bleed.  Patient reports that his gastroenterologist is Dr. Matthias HughsBuccini and his prior colonoscopies have been unremarkable, last colonoscopy was 9 years ago. Hemoglobin 10.1 on admission, close to baseline COVID-19 test negative.  Per patient he has been home and not in contact with any covid + person.  ED work-up/course:  In ED, temp 98.2, pulse 91, BP 130/71 however significantly orthostatic on standing BP plummeted down to 69/54  CT abdomen showed extensive colonic diverticulosis, no associated acute diverticulitis.  Review of Systems: Positives marked in  'bold' Constitutional: Denies fever, chills, diaphoresis, poor appetite and fatigue.  HEENT: Denies photophobia, eye pain, redness, hearing loss, ear pain, congestion, sore throat, rhinorrhea, sneezing, mouth sores, trouble swallowing, neck pain, neck stiffness and tinnitus.   Respiratory: Denies SOB, DOE, cough, chest tightness,  and wheezing.   Cardiovascular: Denies chest pain, palpitations and leg swelling.  Gastrointestinal: See HPI Genitourinary: Denies dysuria, urgency, frequency, hematuria, flank pain and difficulty urinating.  Musculoskeletal: Denies myalgias, back pain, joint swelling, arthralgias and gait problem.  Skin: Denies pallor, rash and wound.  Neurological: Denies seizures, syncope, weakness, numbness and headaches. + Dizziness, lightheadedness Hematological: Denies adenopathy. Easy bruising, personal or family bleeding history  Psychiatric/Behavioral: Denies suicidal ideation, mood changes, confusion, nervousness, sleep disturbance and agitation  Past Medical History: Past Medical History:  Diagnosis Date   Hypercholesterolemia    Osteoarthritis    Tinnitus    both ears    Past Surgical History:  Procedure Laterality Date   HERNIA REPAIR  1980's   periodontal gum surgery  1980's   pre skin cancer removed from left leg  jan 2013   right foot surgery  nov 2012   rigth shoulder surgery for bune spurs  nov 2011   TONSILLECTOMY  age 13   TOTAL HIP ARTHROPLASTY  04/01/2012   Procedure: TOTAL HIP ARTHROPLASTY ANTERIOR APPROACH;  Surgeon: Cristal Deerhristopher  Aretha Parrot, MD;  Location: [facility];  Service: Orthopedics;  Laterality: Left;  Left Total Hip Arthroplasty   wisdom teeth extarction  25-30 yrs ago    Medications: Prior to Admission medications   Medication Sig Start Date End Date Taking? Authorizing Provider  acetaminophen (TYLENOL) 325 MG tablet Take 325 mg by mouth every 6 (six) hours as needed (For foot pain.).   Yes [provider]  ALPRAZolam  (XANAX) 0.25 MG tablet Take 1 tablet (0.25 mg total) by mouth 2 (two) times daily as needed. Patient taking differently: Take 0.25-0.5 mg by mouth at bedtime as needed for sleep.  10/09/15  Yes Cassell Clement, MD  gabapentin (NEURONTIN) 300 MG capsule Take 300 mg by mouth 5 (five) times daily. 03/29/19  Yes [provider]  OVER THE COUNTER MEDICATION Take 60 mLs by mouth daily. Isotonic OPC 3 Powder   Yes [provider]  simvastatin (ZOCOR) 40 MG tablet TAKE 1 TABLET (40 MG TOTAL) BY MOUTH AT BEDTIME. Patient taking differently: Take 40 mg by mouth every evening.  05/13/15  Yes Cassell Clement, MD  tetrahydrozoline 0.05 % ophthalmic solution Place 1 drop into both eyes 2 (two) times daily as needed (For eye irritation.).    Yes [provider]  traMADol (ULTRAM) 50 MG tablet Take 1 tablet (50 mg total) by mouth at bedtime as needed and may repeat dose one time if needed for pain. 03/27/13  Yes Kirsteins, Victorino Sparrow, MD    Allergies:  No Known Allergies  Social History:  reports that he quit smoking about 45 years ago. His smoking use included cigarettes. He quit after 10.00 years of use. He has quit using smokeless tobacco. He reports current alcohol use. He reports that he does not use drugs.  Family History: Family History  Problem Relation Age of Onset   Heart disease Mother 1   Heart failure Mother 65   Heart failure Father 28    Physical Exam: Blood pressure 136/74, pulse 97, temperature 98.2 F (36.8 C), temperature source Oral, resp. rate 20, height  (1.854 m), weight 83.9 kg, SpO2 100 %. General: Alert, awake, oriented x3, in no acute distress. Eyes: pink conjunctiva,anicteric sclera, pupils equal and reactive to light and accomodation, HEENT: normocephalic, atraumatic, oropharynx clear Neck: supple, no masses or lymphadenopathy, no goiter, no bruits, no JVD CVS: Regular rate and rhythm, without murmurs, rubs or gallops. No lower extremity  edema Resp : Clear to auscultation bilaterally, no wheezing, rales or rhonchi. GI : Soft, nontender, nondistended, positive bowel sounds, no masses. No hepatomegaly. No hernia.  Musculoskeletal: No clubbing or cyanosis, positive pedal pulses. No contracture. ROM intact  Neuro: Grossly intact, no focal neurological deficits, strength 5/5 upper and lower extremities bilaterally Psych: alert and oriented x 3, normal mood and affect Skin: no rashes or lesions, warm and dry   LABS on Admission: I have personally reviewed all the labs and imagings below    Basic Metabolic Panel: Recent Labs  Lab 03/30/19 0943  NA 134*  K 4.0  CL 102  CO2 23  GLUCOSE 130*  BUN 20  CREATININE 0.94  CALCIUM 8.1*   Liver Function Tests: Recent Labs  Lab 03/30/19 0943  AST 29  ALT 21  ALKPHOS 51  BILITOT 0.6  PROT 6.3*  ALBUMIN 3.8   No results for input(s): LIPASE, AMYLASE in the last 168 hours. No results for input(s): AMMONIA in the last 168 hours. CBC: Recent Labs  Lab 03/30/19 [phone]  WBC 9.0  NEUTROABS 7.6  HGB 10.1*  HCT 30.1*  MCV 95.6  PLT 241   Cardiac Enzymes: No results for input(s): CKTOTAL, CKMB, CKMBINDEX, TROPONINI in the last 168 hours. BNP: Invalid input(s): POCBNP CBG: No results for input(s): GLUCAP in the last 168 hours.  Radiological Exams on Admission:  Ct Abdomen Pelvis W Contrast  Result Date: 03/30/2019 CLINICAL DATA:  78 year old male with history of bright red blood in stool for the past 3 days. No abdominal pain, nausea or vomiting. EXAM: CT ABDOMEN AND PELVIS WITH CONTRAST TECHNIQUE: Multidetector CT imaging of the abdomen and pelvis was performed using the standard protocol following bolus administration of intravenous contrast. CONTRAST:  OMNIPAQUE IOHEXOL 300 MG/ML  SOLN COMPARISON:  No priors. FINDINGS: Lower chest: Tiny left-sided Bochdalek's hernia incidentally noted. Hepatobiliary: No suspicious cystic or solid hepatic lesions. No intra or  extrahepatic biliary ductal dilatation. Gallbladder is normal in appearance. Pancreas: No pancreatic mass. No pancreatic ductal dilatation. No pancreatic or peripancreatic fluid or inflammatory changes. Spleen: Unremarkable. Adrenals/Urinary Tract: Bilateral kidneys and bilateral adrenal glands are normal in appearance. No hydroureteronephrosis. Urinary bladder partially obscured by beam hardening artifact from the patient's left hip arthroplasty, but appears normal. Stomach/Bowel: Normal appearance of the stomach. No pathologic dilatation of small bowel or colon. Numerous colonic diverticulae are noted, particularly in the region of the sigmoid colon. At this time, there are no surrounding inflammatory changes to suggest an associated acute diverticulitis. Normal appendix. 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 No lymphadenopathy noted in the abdomen or pelvis. Reproductive: Prostate gland and seminal vesicles are partially obscured by beam hardening artifact but are generally normal in appearance. Other: No significant volume of ascites.  No pneumoperitoneum. Musculoskeletal: Status post left hip arthroplasty there are no aggressive appearing lytic or blastic lesions noted in the visualized portions of the skeleton. IMPRESSION: 1. No acute findings in the abdomen or pelvis. However, there is extensive colonic diverticulosis which could account for the patient's history of hematochezia. No findings to suggest an associated acute diverticulitis at this time. 2. Aortic atherosclerosis with aneurysmal dilatation of the right common iliac artery which measures up to 2.7 cm in diameter. 3. Additional incidental findings, as above. Electronically Signed   By: Trudie Reed M.D.   On: 03/30/2019 11:14      EKG: Independently reviewed.  Rate 136, sinus tachycardia   Assessment/Plan Principal  Problem: Acute lower GIB (gastrointestinal bleeding), underlying history of chronic anemia -CT scan showed extensive colonic diverticulosis, likely the cause of painless hematochezia -Continue n.p.o. status, IV fluids, serial H&H every 8 hours, transfuse for hemoglobin less than 7 -Nuclear medicine tagged RBC scan pending - [facility] has been consulted by EDP  Active Problems: Orthostatic hypotension: Likely due to hypovolemia and #1 -BP down to 60s, significantly orthostatic  - continue IV fluid hydration, repeat H&H, transfuse if hemoglobin less than 7    Hypercholesterolemia -Currently n.p.o., holding statin   DVT prophylaxis: SCDs  CODE STATUS: Full code  Consults called: [facility]  Family Communication: Admission, patients condition and plan of care including tests being ordered have been discussed with the patient who indicates understanding and agree with the plan and Code Status  Admission status: Inpatient telemetry  Disposition plan: Further plan will depend as patient's clinical course evolves and further radiologic and laboratory data become available.    At the time of admission, it appears that the appropriate  admission status for this patient is INPATIENT . This is judged to be reasonable and necessary in order to provide the required intensity of service to ensure the patient's safety given the presenting symptoms ongoing rectal bleeding, severely orthostatic, physical exam findings, and initial radiographic and laboratory data in the context of their chronic comorbidities.  The medical decision making on this patient was of high complexity and the patient is at high risk for clinical deterioration, therefore this is a level 3 visit.   Time Spent on Admission: 60 minutes    Jasneet Schobert M.D. [facility] 03/30/2019, 12:00 PM

## 2019-03-30 NOTE — ED Notes (Signed)
Patient transported to CT 

## 2019-03-30 NOTE — ED Notes (Signed)
ED TO INPATIENT HANDOFF REPORT  Name/Age/Gender Matthew Collins 78 y.o. male  Code Status Code Status History    Date Active Date Inactive Code Status Order ID Comments User Context   04/01/2012 1118 04/03/2012 1408 Full Code [id number]  Iantha Fallen, RN Inpatient    Advance Directive Documentation     Most Recent Value  Type of Advance Directive  Healthcare Power of Attorney, Living will  Pre-existing out of facility DNR order (yellow form or pink MOST form)  -  "MOST" Form in Place?  -      Home/SNF/Other Home  Chief Complaint blood in stool  Level of Care/Admitting Diagnosis ED Disposition    ED Disposition Condition Comment   Admit  Hospital Area: [facility] [[id number]]  Level of Care: Telemetry [5]  Admit to tele based on following criteria: Other see comments  Comments: orthostatic, GI bleed  Covid Evaluation: Screening Protocol (No Symptoms)  Diagnosis: GIB (gastrointestinal bleeding) [540981]  Admitting Physician: RAI, Delene Ruffini [[id number]]  Attending Physician: RAI, RIPUDEEP K [[id number]]  Estimated length of stay: past midnight tomorrow  Certification:: I certify this patient will need inpatient services for at least 2 midnights  PT Class (Do Not Modify): Inpatient [101]  PT Acc Code (Do Not Modify): Private [1]       Medical History Past Medical History:  Diagnosis Date  . Hypercholesterolemia   . Osteoarthritis   . Tinnitus    both ears    Allergies No Known Allergies  IV Location/Drains/Wounds Patient Lines/Drains/Airways Status   Active Line/Drains/Airways    Name:   Placement date:   Placement time:   Site:   Days:   Peripheral IV 03/30/19 Right Antecubital   03/30/19    0945    Antecubital   less than 1   Incision 04/01/12 Hip Left   04/01/12    0845     2554          Labs/Imaging Results for orders placed or performed during the hospital encounter of 03/30/19 (from the past 48 hour(s))  Comprehensive metabolic panel      Status: Abnormal   Collection Time: 03/30/19  9:43 AM  Result Value Ref Range   Sodium 134 (L) 135 - 145 mmol/L   Potassium 4.0 3.5 - 5.1 mmol/L   Chloride 102 98 - 111 mmol/L   CO2 23 22 - 32 mmol/L   Glucose, Bld 130 (H) 70 - 99 mg/dL   BUN 20 8 - 23 mg/dL   Creatinine, Ser 1.91 0.61 - 1.24 mg/dL   Calcium 8.1 (L) 8.9 - 10.3 mg/dL   Total Protein 6.3 (L) 6.5 - 8.1 g/dL   Albumin 3.8 3.5 - 5.0 g/dL   AST 29 15 - 41 U/L   ALT 21 0 - 44 U/L   Alkaline Phosphatase 51 38 - 126 U/L   Total Bilirubin 0.6 0.3 - 1.2 mg/dL   GFR calc non Af Amer >60 >60 mL/min   GFR calc Af Amer >60 >60 mL/min   Anion gap 9 5 - 15    Comment: Performed at [facility], [address] [place], [place] [address]  CBC WITH DIFFERENTIAL     Status: Abnormal   Collection Time: 03/30/19  9:43 AM  Result Value Ref Range   WBC 9.0 4.0 - 10.5 K/uL   RBC 3.15 (L) 4.22 - 5.81 MIL/uL   Hemoglobin 10.1 (L) 13.0 - 17.0 g/dL   HCT 56.2 (L) 13.0 - 86.5 %  MCV 95.6 80.0 - 100.0 fL   MCH 32.1 26.0 - 34.0 pg   MCHC 33.6 30.0 - 36.0 g/dL   RDW 16.1 09.6 - 04.5 %   Platelets 241 150 - 400 K/uL   nRBC 0.0 0.0 - 0.2 %   Neutrophils Relative % 85 %   Neutro Abs 7.6 1.7 - 7.7 K/uL   Lymphocytes Relative 11 %   Lymphs Abs 1.0 0.7 - 4.0 K/uL   Monocytes Relative 4 %   Monocytes Absolute 0.4 0.1 - 1.0 K/uL   Eosinophils Relative 0 %   Eosinophils Absolute 0.0 0.0 - 0.5 K/uL   Basophils Relative 0 %   Basophils Absolute 0.0 0.0 - 0.1 K/uL   Immature Granulocytes 0 %   Abs Immature Granulocytes 0.04 0.00 - 0.07 K/uL    Comment: Performed at [facility], [address], [place], [place] [address]  Type and screen [facility]     Status: None   Collection Time: 03/30/19  9:43 AM  Result Value Ref Range   ABO/RH(D) A POS    Antibody Screen NEG    Sample Expiration      04/02/2019,2359 Performed at [facility], [address]  [place], [place] [address]   SARS Coronavirus 2 (CEPHEID - Performed in [facility] hospital lab), Hosp Order     Status: None   Collection Time: 03/30/19  9:43 AM  Result Value Ref Range   SARS Coronavirus 2 NEGATIVE NEGATIVE    Comment: (NOTE) If result is NEGATIVE SARS-CoV-2 target nucleic acids are NOT DETECTED. The SARS-CoV-2 RNA is generally detectable in upper and lower  respiratory specimens during the acute phase of infection. The lowest  concentration of SARS-CoV-2 viral copies this assay can detect is 250  copies / mL. A negative result does not preclude SARS-CoV-2 infection  and should not be used as the sole basis for treatment or other  patient management decisions.  A negative result may occur with  improper specimen collection / handling, submission of specimen other  than nasopharyngeal swab, presence of viral mutation(s) within the  areas targeted by this assay, and inadequate number of viral copies  (<250 copies / mL). A negative result must be combined with clinical  observations, patient history, and epidemiological information. If result is POSITIVE SARS-CoV-2 target nucleic acids are DETECTED. The SARS-CoV-2 RNA is generally detectable in upper and lower  respiratory specimens dur ing the acute phase of infection.  Positive  results are indicative of active infection with SARS-CoV-2.  Clinical  correlation with patient history and other diagnostic information is  necessary to determine patient infection status.  Positive results do  not rule out bacterial infection or co-infection with other viruses. If result is PRESUMPTIVE POSTIVE SARS-CoV-2 nucleic acids MAY BE PRESENT.   A presumptive positive result was obtained on the submitted specimen  and confirmed on repeat testing.  While 2019 novel coronavirus  (SARS-CoV-2) nucleic acids may be present in the submitted sample  additional confirmatory testing may be necessary for epidemiological  and / or clinical  management purposes  to differentiate between  SARS-CoV-2 and other Sarbecovirus currently known to infect humans.  If clinically indicated additional testing with an alternate test  methodology [phone]) is advised. The SARS-CoV-2 RNA is generally  detectable in upper and lower respiratory sp ecimens during the acute  phase of infection. The expected result is Negative. Fact Sheet for Patients:  BoilerBrush.com.cy Fact Sheet for Healthcare Providers:  https://pope.com/ This test is not yet approved or cleared by the [place] and has been authorized for detection and/or diagnosis of SARS-CoV-2 by FDA under an Emergency Use Authorization (EUA).  This EUA will remain in effect (meaning this test can be used) for the duration of the COVID-19 declaration under Section 564(b)(1) of the Act, 21 U.S.C. section 360bbb-3(b)(1), unless the authorization is terminated or revoked sooner. Performed at [facility], [address], [place], [place] [address]   POC occult blood, ED     Status: Abnormal   Collection Time: 03/30/19 10:01 AM  Result Value Ref Range   Fecal Occult Bld POSITIVE (A) NEGATIVE   Ct Abdomen Pelvis W Contrast  Result Date: 03/30/2019 CLINICAL DATA:  78 year old male with history of bright red blood in stool for the past 3 days. No abdominal pain, nausea or vomiting. EXAM: CT ABDOMEN AND PELVIS WITH CONTRAST TECHNIQUE: Multidetector CT imaging of the abdomen and pelvis was performed using the standard protocol following bolus administration of intravenous contrast. CONTRAST:  OMNIPAQUE IOHEXOL 300 MG/ML  SOLN COMPARISON:  No priors. FINDINGS: Lower chest: Tiny left-sided Bochdalek's hernia incidentally noted. Hepatobiliary: No suspicious cystic or solid hepatic lesions. No intra or extrahepatic biliary ductal dilatation. Gallbladder is normal in appearance. Pancreas: No pancreatic mass. No pancreatic  ductal dilatation. No pancreatic or peripancreatic fluid or inflammatory changes. Spleen: Unremarkable. Adrenals/Urinary Tract: Bilateral kidneys and bilateral adrenal glands are normal in appearance. No hydroureteronephrosis. Urinary bladder partially obscured by beam hardening artifact from the patient's left hip arthroplasty, but appears normal. Stomach/Bowel: Normal appearance of the stomach. No pathologic dilatation of small bowel or colon. Numerous colonic diverticulae are noted, particularly in the region of the sigmoid colon. At this time, there are no surrounding inflammatory changes to suggest an associated acute diverticulitis. Normal appendix. 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 No lymphadenopathy noted in the abdomen or pelvis. Reproductive: Prostate gland and seminal vesicles are partially obscured by beam hardening artifact but are generally normal in appearance. Other: No significant volume of ascites.  No pneumoperitoneum. Musculoskeletal: Status post left hip arthroplasty there are no aggressive appearing lytic or blastic lesions noted in the visualized portions of the skeleton. IMPRESSION: 1. No acute findings in the abdomen or pelvis. However, there is extensive colonic diverticulosis which could account for the patient's history of hematochezia. No findings to suggest an associated acute diverticulitis at this time. 2. Aortic atherosclerosis with aneurysmal dilatation of the right common iliac artery which measures up to 2.7 cm in diameter. 3. Additional incidental findings, as above. Electronically Signed   By: Trudie Reed M.D.   On: 03/30/2019 11:14    Pending Labs [employer] (From admission, onward)    Start     Ordered   Signed and Armed forces training and education officer morning,   R     Signed and Held   Signed and Held  CBC  Tomorrow morning,   R     Signed and  Held   Signed and Held  Hemoglobin and hematocrit, blood  Now then every 8 hours,   R     Signed and Held          Vitals/Pain Today's Vitals   03/30/19 1200 03/30/19 1230 03/30/19 1300 03/30/19 1330  BP: 138/79 129/78 132/76 129/79  Pulse:   (!) 102 97  Resp:      Temp:  TempSrc:      SpO2:   94% 100%  Weight:      Height:      PainSc:        Isolation Precautions No active isolations  Medications Medications  sodium chloride (PF) 0.9 % injection (has no administration in time range)  0.9 %  sodium chloride infusion ( Intravenous New Bag/Given 03/30/19 1316)  sodium chloride 0.9 % bolus 500 mL (0 mLs Intravenous Stopped 03/30/19 1138)  iohexol (OMNIPAQUE) 300 MG/ML solution 100 mL (100 mLs Intravenous Contrast Given 03/30/19 1052)    Mobility walks

## 2019-03-30 NOTE — Consult Note (Signed)
Reason for Consult: Bright red blood per rectum Referring Physician: Hospital team  Matthew Collins is an 78 y.o. male.  HPI: Patient seen and examined and hospital computer chart reviewed and our office computer chart reviewed and patient has had no previous GI bleeding and a colonoscopy 4 years ago did show left-sided diverticuli and he does not have any GI symptoms till he started having a little bright red blood per rectum on Monday and it seemed to increase today and he has not seen any clots or black stools and he blames the bleeding on too much kale and almonds and he is not on any aspirin blood thinners or nonsteroidals and his family history is negative for any GI problems and he has no other complaints   Past Medical History:  Diagnosis Date  . Hypercholesterolemia   . Osteoarthritis   . Tinnitus    both ears    Past Surgical History:  Procedure Laterality Date  . HERNIA REPAIR  1980's  . periodontal gum surgery  1980's  . pre skin cancer removed from left leg  jan 2013  . right foot surgery  nov 2012  . rigth shoulder surgery for bune spurs  nov 2011  . TONSILLECTOMY  age 70  . TOTAL HIP ARTHROPLASTY  04/01/2012   Procedure: TOTAL HIP ARTHROPLASTY ANTERIOR APPROACH;  Surgeon: Kathryne Hitch, MD;  Location: [facility];  Service: Orthopedics;  Laterality: Left;  Left Total Hip Arthroplasty  . wisdom teeth extarction  25-30 yrs ago    Family History  Problem Relation Age of Onset  . Heart disease Mother 21  . Heart failure Mother 69  . Heart failure Father 65    Social History:  reports that he quit smoking about 45 years ago. His smoking use included cigarettes. He quit after 10.00 years of use. He has quit using smokeless tobacco. He reports current alcohol use. He reports that he does not use drugs.  Allergies: No Known Allergies  Medications: I have reviewed the patient's current medications.  Results for orders placed or performed during the hospital encounter  of 03/30/19 (from the past 48 hour(s))  Comprehensive metabolic panel     Status: Abnormal   Collection Time: 03/30/19  9:43 AM  Result Value Ref Range   Sodium 134 (L) 135 - 145 mmol/L   Potassium 4.0 3.5 - 5.1 mmol/L   Chloride 102 98 - 111 mmol/L   CO2 23 22 - 32 mmol/L   Glucose, Bld 130 (H) 70 - 99 mg/dL   BUN 20 8 - 23 mg/dL   Creatinine, Ser 1.61 0.61 - 1.24 mg/dL   Calcium 8.1 (L) 8.9 - 10.3 mg/dL   Total Protein 6.3 (L) 6.5 - 8.1 g/dL   Albumin 3.8 3.5 - 5.0 g/dL   AST 29 15 - 41 U/L   ALT 21 0 - 44 U/L   Alkaline Phosphatase 51 38 - 126 U/L   Total Bilirubin 0.6 0.3 - 1.2 mg/dL   GFR calc non Af Amer >60 >60 mL/min   GFR calc Af Amer >60 >60 mL/min   Anion gap 9 5 - 15    Comment: Performed at [facility], [address], [place], [place] [address]  CBC WITH DIFFERENTIAL     Status: Abnormal   Collection Time: 03/30/19  9:43 AM  Result Value Ref Range   WBC 9.0 4.0 - 10.5 K/uL   RBC 3.15 (L) 4.22 - 5.81 MIL/uL   Hemoglobin 10.1 (L)  13.0 - 17.0 g/dL   HCT 16.1 (L) 09.6 - 04.5 %   MCV 95.6 80.0 - 100.0 fL   MCH 32.1 26.0 - 34.0 pg   MCHC 33.6 30.0 - 36.0 g/dL   RDW 40.9 81.1 - 91.4 %   Platelets 241 150 - 400 K/uL   nRBC 0.0 0.0 - 0.2 %   Neutrophils Relative % 85 %   Neutro Abs 7.6 1.7 - 7.7 K/uL   Lymphocytes Relative 11 %   Lymphs Abs 1.0 0.7 - 4.0 K/uL   Monocytes Relative 4 %   Monocytes Absolute 0.4 0.1 - 1.0 K/uL   Eosinophils Relative 0 %   Eosinophils Absolute 0.0 0.0 - 0.5 K/uL   Basophils Relative 0 %   Basophils Absolute 0.0 0.0 - 0.1 K/uL   Immature Granulocytes 0 %   Abs Immature Granulocytes 0.04 0.00 - 0.07 K/uL    Comment: Performed at [facility], [address], [place], [place] [address]  Type and screen [facility]     Status: None   Collection Time: 03/30/19  9:43 AM  Result Value Ref Range   ABO/RH(D) A POS    Antibody Screen NEG    Sample Expiration       04/02/2019,2359 Performed at [facility], [address], [place], [place] [address]   SARS Coronavirus 2 (CEPHEID - Performed in [facility] hospital lab), Hosp Order     Status: None   Collection Time: 03/30/19  9:43 AM  Result Value Ref Range   SARS Coronavirus 2 NEGATIVE NEGATIVE    Comment: (NOTE) If result is NEGATIVE SARS-CoV-2 target nucleic acids are NOT DETECTED. The SARS-CoV-2 RNA is generally detectable in upper and lower  respiratory specimens during the acute phase of infection. The lowest  concentration of SARS-CoV-2 viral copies this assay can detect is 250  copies / mL. A negative result does not preclude SARS-CoV-2 infection  and should not be used as the sole basis for treatment or other  patient management decisions.  A negative result may occur with  improper specimen collection / handling, submission of specimen other  than nasopharyngeal swab, presence of viral mutation(s) within the  areas targeted by this assay, and inadequate number of viral copies  (<250 copies / mL). A negative result must be combined with clinical  observations, patient history, and epidemiological information. If result is POSITIVE SARS-CoV-2 target nucleic acids are DETECTED. The SARS-CoV-2 RNA is generally detectable in upper and lower  respiratory specimens dur ing the acute phase of infection.  Positive  results are indicative of active infection with SARS-CoV-2.  Clinical  correlation with patient history and other diagnostic information is  necessary to determine patient infection status.  Positive results do  not rule out bacterial infection or co-infection with other viruses. If result is PRESUMPTIVE POSTIVE SARS-CoV-2 nucleic acids MAY BE PRESENT.   A presumptive positive result was obtained on the submitted specimen  and confirmed on repeat testing.  While 2019 novel coronavirus  (SARS-CoV-2) nucleic acids may be present in the submitted sample  additional  confirmatory testing may be necessary for epidemiological  and / or clinical management purposes  to differentiate between  SARS-CoV-2 and other Sarbecovirus currently known to infect humans.  If clinically indicated additional testing with an alternate test  methodology [phone]) is advised. The SARS-CoV-2 RNA is generally  detectable in upper and lower respiratory sp ecimens during the acute  phase of infection. The  expected result is Negative. Fact Sheet for Patients:  BoilerBrush.com.cy Fact Sheet for Healthcare Providers: https://pope.com/ This test is not yet approved or cleared by the [place] FDA and has been authorized for detection and/or diagnosis of SARS-CoV-2 by FDA under an Emergency Use Authorization (EUA).  This EUA will remain in effect (meaning this test can be used) for the duration of the COVID-19 declaration under Section 564(b)(1) of the Act, 21 U.S.C. section 360bbb-3(b)(1), unless the authorization is terminated or revoked sooner. Performed at [facility], [address] [place], [place] [address]   POC occult blood, ED     Status: Abnormal   Collection Time: 03/30/19 10:01 AM  Result Value Ref Range   Fecal Occult Bld POSITIVE (A) NEGATIVE    Ct Abdomen Pelvis W Contrast  Result Date: 03/30/2019 CLINICAL DATA:  78 year old male with history of bright red blood in stool for the past 3 days. No abdominal pain, nausea or vomiting. EXAM: CT ABDOMEN AND PELVIS WITH CONTRAST TECHNIQUE: Multidetector CT imaging of the abdomen and pelvis was performed using the standard protocol following bolus administration of intravenous contrast. CONTRAST:  OMNIPAQUE IOHEXOL 300 MG/ML  SOLN COMPARISON:  No priors. FINDINGS: Lower chest: Tiny left-sided Bochdalek's hernia incidentally noted. Hepatobiliary: No suspicious cystic or solid hepatic lesions. No intra or extrahepatic biliary ductal dilatation.  Gallbladder is normal in appearance. Pancreas: No pancreatic mass. No pancreatic ductal dilatation. No pancreatic or peripancreatic fluid or inflammatory changes. Spleen: Unremarkable. Adrenals/Urinary Tract: Bilateral kidneys and bilateral adrenal glands are normal in appearance. No hydroureteronephrosis. Urinary bladder partially obscured by beam hardening artifact from the patient's left hip arthroplasty, but appears normal. Stomach/Bowel: Normal appearance of the stomach. No pathologic dilatation of small bowel or colon. Numerous colonic diverticulae are noted, particularly in the region of the sigmoid colon. At this time, there are no surrounding inflammatory changes to suggest an associated acute diverticulitis. Normal appendix. 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 No lymphadenopathy noted in the abdomen or pelvis. Reproductive: Prostate gland and seminal vesicles are partially obscured by beam hardening artifact but are generally normal in appearance. Other: No significant volume of ascites.  No pneumoperitoneum. Musculoskeletal: Status post left hip arthroplasty there are no aggressive appearing lytic or blastic lesions noted in the visualized portions of the skeleton. IMPRESSION: 1. No acute findings in the abdomen or pelvis. However, there is extensive colonic diverticulosis which could account for the patient's history of hematochezia. No findings to suggest an associated acute diverticulitis at this time. 2. Aortic atherosclerosis with aneurysmal dilatation of the right common iliac artery which measures up to 2.7 cm in diameter. 3. Additional incidental findings, as above. Electronically Signed   By: Trudie Reed M.D.   On: 03/30/2019 11:14    ROSBlood pressure 129/79, pulse 97, temperature 98.2 F (36.8 C), temperature source Oral, resp. rate 20, height 6\' 1"  (1.854 m), weight 83.9 kg,  SpO2 100 %. Physical Exam vital signs stable afebrile no acute distress patient lying comfortably in the bed lungs are clear regular rate and rhythm abdomen is soft nontender labs and CT reviewed Assessment/Plan: Probable diverticular bleeding Plan: Await nuclear bleeding scan results if positive stat IR consult if negative and bleeding continues consider delayed scans either this evening or tomorrow a.m. since the tracer should last 24 hours otherwise we will allow clear liquids and will follow with you call if any specific question or problem  Lounell Schumacher E 03/30/2019, 3:16 PM

## 2019-03-30 NOTE — ED Triage Notes (Signed)
Pt to ED reports blood in stool since Monday. Reports last bloody stool this morning.Reports bright red blood. Denies abdominal pain. Denies N/V.

## 2019-03-30 NOTE — ED Notes (Signed)
Pt being transferred to radiology for nuc med study, was informed that patient will be gone for about 3 hours.

## 2019-03-30 NOTE — ED Provider Notes (Signed)
[facility]-EMERGENCY DEPT Provider Note   CSN: [id number] Arrival date & time: 03/30/19  0906   History   Chief Complaint Chief Complaint  Patient presents with  . Rectal Bleeding    HPI Matthew Collins is a 78 y.o. male with a hx of hypercholesterolemia, anemia, & former tobacco use who presents to the ED w/ complaints of bloody diarrhea that began 4 days prior. Day of onset patient had 5 episodes of bloody diarrhea described as bright red blood. The next AM he took imodium which seemed to stop his sxs, no BM that day, yesterday had normal BM mid-day but then last night bloody diarrhea returned. Reports 3 episodes last evening, 1 this AM. No alleviating/aggravating factors other than imodium. He is concerned he has lost significant amount of blood. Has had some episodes of mild lightheadedness like he may pass out when transitioning to standing from the commode, no syncope has occurred. Denies fever, chills, chest pain, dyspnea, syncope, nausea, vomiting, hematemesis, or abdominal pain. NO hx of prior GI bleed. Does not take blood thinners.   Denies recent foreign travel, abx, or suspicion PO intake.  Denies increase in EtOH or NSAIDs. Last colonoscopy was approximately 9 years prior, all have been normal in the past.    HPI  Past Medical History:  Diagnosis Date  . Hypercholesterolemia   . Osteoarthritis   . Tinnitus    both ears    Patient Active Problem List   Diagnosis Date Noted  . Microcytic hypochromic anemia 02/13/2015  . Back pain, chronic 03/27/2013  . Degenerative arthritis of hip 04/01/2012  . Hypercholesterolemia   . Osteoarthritis     Past Surgical History:  Procedure Laterality Date  . HERNIA REPAIR  1980's  . periodontal gum surgery  1980's  . pre skin cancer removed from left leg  jan 2013  . right foot surgery  nov 2012  . rigth shoulder surgery for bune spurs  nov 2011  . TONSILLECTOMY  age 50  . TOTAL HIP ARTHROPLASTY  04/01/2012    Procedure: TOTAL HIP ARTHROPLASTY ANTERIOR APPROACH;  Surgeon: Kathryne Hitch, MD;  Location: [facility];  Service: Orthopedics;  Laterality: Left;  Left Total Hip Arthroplasty  . wisdom teeth extarction  25-30 yrs ago        Home Medications    Prior to Admission medications   Medication Sig Start Date End Date Taking? Authorizing Provider  ALPRAZolam (XANAX) 0.25 MG tablet Take 1 tablet (0.25 mg total) by mouth 2 (two) times daily as needed. 10/09/15   Cassell Clement, MD  Gabapentin, PHN, 300 MG TABS Take by mouth. Take five tablets once each day.    [provider]  Multiple Vitamins-Minerals (MULTIVITAMIN PO) Take by mouth. istonic powder daily    [provider]  OVER THE COUNTER MEDICATION ISOTONIC OPC 3 POWDER Q DAY    [provider]  simvastatin (ZOCOR) 40 MG tablet TAKE 1 TABLET (40 MG TOTAL) BY MOUTH AT BEDTIME. 05/13/15   Cassell Clement, MD  tetrahydrozoline 0.05 % ophthalmic solution Place 1 drop into both eyes 2 (two) times daily as needed. Dry eyes    [provider]  traMADol (ULTRAM) 50 MG tablet Take 1 tablet (50 mg total) by mouth at bedtime as needed and may repeat dose one time if needed for pain. 03/27/13   Kirsteins, Victorino Sparrow, MD    Family History Family History  Problem Relation Age of Onset  . Heart disease Mother 71  .  Heart failure Mother 2587  . Heart failure Father 6489    Social History Social History   Tobacco Use  . Smoking status: Former Smoker    Years: 10.00    Types: Cigarettes    Last attempt to quit: 02/09/1974    Years since quitting: 45.1  . Smokeless tobacco: Former Engineer, waterUser  Substance Use Topics  . Alcohol use: Yes    Comment: 2 beer day  . Drug use: No     Allergies   Patient has no known allergies.   Review of Systems Review of Systems  Constitutional: Negative for chills and fever.  Respiratory: Negative for shortness of breath.   Cardiovascular: Negative for chest pain.   Gastrointestinal: Positive for blood in stool and diarrhea. Negative for abdominal pain, nausea and vomiting.  Genitourinary: Negative for dysuria.  Neurological: Positive for light-headedness. Negative for dizziness, syncope and speech difficulty.  All other systems reviewed and are negative.   Physical Exam Updated Vital Signs BP 137/75 (BP Location: Left Arm)   Pulse (!) 101   Temp 98.2 F (36.8 C) (Oral)   Resp 18   Ht 6\' 1"  (1.854 m)   Wt 83.9 kg   SpO2 100%   BMI 24.41 kg/m   Physical Exam Vitals signs and nursing note reviewed. Exam conducted with a chaperone present.  Constitutional:      General: He is not in acute distress.    Appearance: He is well-developed. He is not toxic-appearing.  HENT:     Head: Normocephalic and atraumatic.  Eyes:     General:        Right eye: No discharge.        Left eye: No discharge.     Conjunctiva/sclera: Conjunctivae normal.  Neck:     Musculoskeletal: Neck supple.  Cardiovascular:     Rate and Rhythm: Regular rhythm. Tachycardia present.  Pulmonary:     Effort: Pulmonary effort is normal. No respiratory distress.     Breath sounds: Normal breath sounds. No wheezing, rhonchi or rales.  Abdominal:     General: There is no distension.     Palpations: Abdomen is soft.     Tenderness: There is abdominal tenderness (mild LLQ). There is no right CVA tenderness, left CVA tenderness, guarding or rebound.  Genitourinary:    Rectum: Guaiac result positive. External hemorrhoid (non thrombosed) present.     Comments: RN Morrie Sheldonashley present as chaperone.  DRE: gross blood, no melena.  Skin:    General: Skin is warm and dry.     Findings: No rash.  Neurological:     Mental Status: He is alert.     Comments: Clear speech.   Psychiatric:        Behavior: Behavior normal.    ED Treatments / Results  Labs (all labs ordered are listed, but only abnormal results are displayed) Labs Reviewed  COMPREHENSIVE METABOLIC PANEL - Abnormal;  Notable for the following components:      Result Value   Sodium 134 (*)    Glucose, Bld 130 (*)    Calcium 8.1 (*)    Total Protein 6.3 (*)    All other components within normal limits  CBC WITH DIFFERENTIAL/PLATELET - Abnormal; Notable for the following components:   RBC 3.15 (*)    Hemoglobin 10.1 (*)    HCT 30.1 (*)    All other components within normal limits  POC OCCULT BLOOD, ED - Abnormal; Notable for the following components:   Fecal Occult Bld  POSITIVE (*)    All other components within normal limits  SARS CORONAVIRUS 2 (HOSPITAL ORDER, PERFORMED IN [facility])  TYPE AND SCREEN    EKG EKG Interpretation  Date/Time:  Thursday March 30 2019 09:35:32 EDT Ventricular Rate:  108 PR Interval:    QRS Duration: 114 QT Interval:  326 QTC Calculation: 437 R Axis:   70 Text Interpretation:  Sinus tachycardia Paired ventricular premature complexes Borderline intraventricular conduction delay Baseline wander in lead(s) V3 Confirmed by Benjiman Core [phone]) on 03/30/2019 10:41:25 AM   Radiology Ct Abdomen Pelvis W Contrast  Result Date: 03/30/2019 CLINICAL DATA:  78 year old male with history of bright red blood in stool for the past 3 days. No abdominal pain, nausea or vomiting. EXAM: CT ABDOMEN AND PELVIS WITH CONTRAST TECHNIQUE: Multidetector CT imaging of the abdomen and pelvis was performed using the standard protocol following bolus administration of intravenous contrast. CONTRAST:  OMNIPAQUE IOHEXOL 300 MG/ML  SOLN COMPARISON:  No priors. FINDINGS: Lower chest: Tiny left-sided Bochdalek's hernia incidentally noted. Hepatobiliary: No suspicious cystic or solid hepatic lesions. No intra or extrahepatic biliary ductal dilatation. Gallbladder is normal in appearance. Pancreas: No pancreatic mass. No pancreatic ductal dilatation. No pancreatic or peripancreatic fluid or inflammatory changes. Spleen: Unremarkable. Adrenals/Urinary Tract: Bilateral kidneys and  bilateral adrenal glands are normal in appearance. No hydroureteronephrosis. Urinary bladder partially obscured by beam hardening artifact from the patient's left hip arthroplasty, but appears normal. Stomach/Bowel: Normal appearance of the stomach. No pathologic dilatation of small bowel or colon. Numerous colonic diverticulae are noted, particularly in the region of the sigmoid colon. At this time, there are no surrounding inflammatory changes to suggest an associated acute diverticulitis. Normal appendix. 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 No lymphadenopathy noted in the abdomen or pelvis. Reproductive: Prostate gland and seminal vesicles are partially obscured by beam hardening artifact but are generally normal in appearance. Other: No significant volume of ascites.  No pneumoperitoneum. Musculoskeletal: Status post left hip arthroplasty there are no aggressive appearing lytic or blastic lesions noted in the visualized portions of the skeleton. IMPRESSION: 1. No acute findings in the abdomen or pelvis. However, there is extensive colonic diverticulosis which could account for the patient's history of hematochezia. No findings to suggest an associated acute diverticulitis at this time. 2. Aortic atherosclerosis with aneurysmal dilatation of the right common iliac artery which measures up to 2.7 cm in diameter. 3. Additional incidental findings, as above. Electronically Signed   By: Trudie Reed M.D.   On: 03/30/2019 11:14    Procedures Procedures (including critical care time)  Medications Ordered in ED Medications  sodium chloride 0.9 % bolus 500 mL (has no administration in time range)    Initial Impression / Assessment and Plan / ED Course  I have reviewed the triage vital signs and the nursing notes.  Pertinent labs & imaging results that were available during my care of the  patient were reviewed by me and considered in my medical decision making (see chart for details).   Patient presents to the ED w/ complaints of bloody diarrhea. Nontoxic appearing, no apparent distress, vitals w/ mild tachycardia otherwise WNL. Exam w/ mild LLQ abdominal tenderness w/o peritoneal signs. DRE w/ gross blood, no melena, small external hemorrhoid present. Suspect lower GI bleed, considering diverticular source. Plan for labs to include type & screen, EKG, & CT abdomen/pelvis.   Work-up reviewed:  CBC: hgb/hct 10.1/30.1-  no significant change from prior on record. No leukocytosis.  CMP: Mild hypocalcemia/hyponatremia. Glucose mildly elevated @ 130. Renal function & LFTs WNL.  Fecal occult positive as to be expected w/ gross blood on exam.  COVID test sent as admission anticipated negative.  EKG: Sinus tach.   Orthostatic vitals consistent w/ orthostatic hypotension, BP standing 69/54.  Has received 500 ccs of fluid in the ER.   CT abdomen/pelvis: No acute findings, diverticulosis without evidence of diverticulitis which may be contributory to GI bleed.   Given patient w/ what is suspected to be a lower GI bleed, + orthostatic vitals, and multiple episodes of bloody diarrhea will plan for admission. Consult to hospitalist service & to GI- patient has seen Dr. Matthias Hughs in the past therefore will place consult to [facility].   11:35: CONSULT: Discussed w/ hospitalist Dr. Isidoro Donning- accepts admission.   Findings and plan of care discussed with supervising physician Dr. Rubin Payor who has discussed w/ [facility]- requested [facility] GI blood loss scan which he has ordered, Dr. Rubin Payor is in agreement w/ care/plan.    Matthew Collins was evaluated in Emergency Department on 03/30/2019 for the symptoms described in the history of present illness. He/she was evaluated in the context of the global COVID-19 pandemic, which necessitated consideration that the patient might be at risk for infection with  the SARS-CoV-2 virus that causes COVID-19. Institutional protocols and algorithms that pertain to the evaluation of patients at risk for COVID-19 are in a state of rapid change based on information released by regulatory bodies including the CDC and federal and state organizations. These policies and algorithms were followed during the patient's care in the ED.  Final Clinical Impressions(s) / ED Diagnoses   Final diagnoses:  Gastrointestinal hemorrhage, unspecified gastrointestinal hemorrhage type  Orthostatic hypotension    ED Discharge Orders    None       Cherly Anderson, PA-C 03/30/19 1354    Benjiman Core, MD 04/01/19 1113

## 2019-03-30 NOTE — ED Notes (Signed)
This nurse call nuc med, informed them pt had a room on floor and for pt to transferred to room when study is complete.

## 2019-03-31 LAB — CBC WITH DIFFERENTIAL/PLATELET
Abs Immature Granulocytes: 0.03 10*3/uL (ref 0.00–0.07)
Basophils Absolute: 0.1 10*3/uL (ref 0.0–0.1)
Basophils Relative: 1 %
Eosinophils Absolute: 0.1 10*3/uL (ref 0.0–0.5)
Eosinophils Relative: 1 %
HCT: 25.7 % — ABNORMAL LOW (ref 39.0–52.0)
Hemoglobin: 8.4 g/dL — ABNORMAL LOW (ref 13.0–17.0)
Immature Granulocytes: 0 %
Lymphocytes Relative: 29 %
Lymphs Abs: 2.3 10*3/uL (ref 0.7–4.0)
MCH: 31.7 pg (ref 26.0–34.0)
MCHC: 32.7 g/dL (ref 30.0–36.0)
MCV: 97 fL (ref 80.0–100.0)
Monocytes Absolute: 0.6 10*3/uL (ref 0.1–1.0)
Monocytes Relative: 8 %
Neutro Abs: 4.8 10*3/uL (ref 1.7–7.7)
Neutrophils Relative %: 61 %
Platelets: 166 10*3/uL (ref 150–400)
RBC: 2.65 MIL/uL — ABNORMAL LOW (ref 4.22–5.81)
RDW: 12.8 % (ref 11.5–15.5)
WBC: 7.8 10*3/uL (ref 4.0–10.5)
nRBC: 0 % (ref 0.0–0.2)

## 2019-03-31 LAB — BASIC METABOLIC PANEL
Anion gap: 4 — ABNORMAL LOW (ref 5–15)
BUN: 13 mg/dL (ref 8–23)
CO2: 23 mmol/L (ref 22–32)
Calcium: 7.6 mg/dL — ABNORMAL LOW (ref 8.9–10.3)
Chloride: 108 mmol/L (ref 98–111)
Creatinine, Ser: 0.85 mg/dL (ref 0.61–1.24)
GFR calc Af Amer: >60 mL/min (ref >60)
GFR calc non Af Amer: >60 mL/min (ref >60)
Glucose, Bld: 90 mg/dL (ref 70–99)
Potassium: 3.8 mmol/L (ref 3.5–5.1)
Sodium: 135 mmol/L (ref 135–145)

## 2019-03-31 LAB — HEMOGLOBIN AND HEMATOCRIT, BLOOD
HCT: 25.3 % — ABNORMAL LOW (ref 39.0–52.0)
Hemoglobin: 8.4 g/dL — ABNORMAL LOW (ref 13.0–17.0)

## 2019-03-31 NOTE — Progress Notes (Signed)
PROGRESS NOTE    Matthew Collins  ZOX:[id number] DOB: [birth year] DOA: 03/30/2019 PCP: Creola Corn, MD   Brief Narrative: Patient is a 78 year old male with history of hyperlipidemia, chronic anemia who presents to the emergency department with hematochezia that started about 4 days ago.  Patient was eating a lot of kale, almonds on the weekend.  Patient was also feeling dizzy and lightheaded.  Orthostatic hypotension was positive.  Denies taking any NSAIDs or blood thinners.  No prior history of GI bleed.  CT abdomen/pelvis showed extensive chronic diverticulosis.  FOBT positive.  GI consulted.  Diverticular bleed suspected.  Nuclear scan test negative.  Assessment & Plan:   Principal Problem:   GIB (gastrointestinal bleeding) Active Problems:   Hypercholesterolemia   Microcytic hypochromic anemia   Acute lower GI bleed/hematochezia: Painless rectal bleeding.  He has history of diverticulosis.  CT here showed extensive chronic diverticulosis which could be the most likely the reason for hematochezia.  GI consulted.  No plan for emergent colonoscopy for now.  Continue to monitor H&H.  Hemoglobin in the range of 8.  Nuclear scan test negative for GI bleed.  Acute blood loss anemia: Associated with lower GI bleed.  We will start on iron supplementation on discharge.  Orthostatic hypotension: Most likely secondary to hypovolemia. He was hypotensive on admission.  Found to have significant orthostatic.  This morning his orthostatic vitals were checked and they were still positive.  Blood pressure in supine was 138/79 mmHg which dropped to 108/75 mmHg on standing.  We will increase the rate of IV fluids.  Hypercholesterolemia: On statin at home         DVT prophylaxis:SCD Code Status: Full Family Communication: None present at the bedside  disposition Plan: Likely home tomorrow if hemoglobin remains stable.   Consultants: GI  Procedures:None.  Antimicrobials:  Anti-infectives  (From admission, onward)   None      Subjective: Patient seen and examined the bedside this morning.  Blood pressure currently stable while supine.  No new rectal bleeding since admission.  Denies any abdominal pain.  Comfortable.  Objective: Vitals:   03/30/19 1756 03/30/19 2102 03/31/19 0537 03/31/19 1205  BP:  136/73 130/76 138/79  Pulse: (!) 107 89 70 74  Resp:  18 20   Temp:  98.7 F (37.1 C) 98.5 F (36.9 C)   TempSrc:  Oral Oral   SpO2:  97% 97% 100%  Weight:      Height:        Intake/Output Summary (Last 24 hours) at 03/31/2019 1332 Last data filed at 03/31/2019 1100 Gross per 24 hour  Intake 1184.28 ml  Output 2000 ml  Net -815.72 ml   Filed Weights   03/30/19 0933 03/30/19 1734  Weight: 83.9 kg 82.6 kg    Examination:  General exam: Appears calm and comfortable ,Not in distress,average built HEENT:PERRL,Oral mucosa moist, Ear/Nose normal on gross exam Respiratory system: Bilateral equal air entry, normal vesicular breath sounds, no wheezes or crackles  Cardiovascular system: S1 & S2 heard, RRR. No JVD, murmurs, rubs, gallops or clicks. No pedal edema. Gastrointestinal system: Abdomen is nondistended, soft and nontender. No organomegaly or masses felt. Normal bowel sounds heard. Central nervous system: Alert and oriented. No focal neurological deficits. Extremities: No edema, no clubbing ,no cyanosis, distal peripheral pulses palpable. Skin: No rashes, lesions or ulcers,no icterus ,no pallor MSK: Normal muscle bulk,tone ,power Psychiatry: Judgement and insight appear normal. Mood & affect appropriate.     Data Reviewed: I have personally  reviewed following labs and imaging studies  CBC: Recent Labs  Lab 03/30/19 0943 03/30/19 1818 03/31/19 0133 03/31/19 0948  WBC 9.0  --  7.8  --   NEUTROABS 7.6  --  4.8  --   HGB 10.1* 9.1* 8.4* 8.4*  HCT 30.1* 27.6* 25.7* 25.3*  MCV 95.6  --  97.0  --   PLT 241  --  166  --    Basic Metabolic Panel: Recent  Labs  Lab 03/30/19 0943 03/31/19 0133  NA 134* 135  K 4.0 3.8  CL 102 108  CO2 23 23  GLUCOSE 130* 90  BUN 20 13  CREATININE 0.94 0.85  CALCIUM 8.1* 7.6*   GFR: Estimated Creatinine Clearance: 83.6 mL/min (by C-G formula based on SCr of 0.85 mg/dL). Liver Function Tests: Recent Labs  Lab 03/30/19 0943  AST 29  ALT 21  ALKPHOS 51  BILITOT 0.6  PROT 6.3*  ALBUMIN 3.8   No results for input(s): LIPASE, AMYLASE in the last 168 hours. No results for input(s): AMMONIA in the last 168 hours. Coagulation Profile: No results for input(s): INR, PROTIME in the last 168 hours. Cardiac Enzymes: No results for input(s): CKTOTAL, CKMB, CKMBINDEX, TROPONINI in the last 168 hours. BNP (last 3 results) No results for input(s): PROBNP in the last 8760 hours. HbA1C: No results for input(s): HGBA1C in the last 72 hours. CBG: No results for input(s): GLUCAP in the last 168 hours. Lipid Profile: No results for input(s): CHOL, HDL, LDLCALC, TRIG, CHOLHDL, LDLDIRECT in the last 72 hours. Thyroid Function Tests: No results for input(s): TSH, T4TOTAL, FREET4, T3FREE, THYROIDAB in the last 72 hours. Anemia Panel: No results for input(s): VITAMINB12, FOLATE, FERRITIN, TIBC, IRON, RETICCTPCT in the last 72 hours. Sepsis Labs: No results for input(s): PROCALCITON, LATICACIDVEN in the last 168 hours.  Recent Results (from the past 240 hour(s))  SARS Coronavirus 2 (CEPHEID - Performed in [facility] lab), Hosp Order     Status: None   Collection Time: 03/30/19  9:43 AM  Result Value Ref Range Status   SARS Coronavirus 2 NEGATIVE NEGATIVE Final    Comment: (NOTE) If result is NEGATIVE SARS-CoV-2 target nucleic acids are NOT DETECTED. The SARS-CoV-2 RNA is generally detectable in upper and lower  respiratory specimens during the acute phase of infection. The lowest  concentration of SARS-CoV-2 viral copies this assay can detect is 250  copies / mL. A negative result does not preclude  SARS-CoV-2 infection  and should not be used as the sole basis for treatment or other  patient management decisions.  A negative result may occur with  improper specimen collection / handling, submission of specimen other  than nasopharyngeal swab, presence of viral mutation(s) within the  areas targeted by this assay, and inadequate number of viral copies  (<250 copies / mL). A negative result must be combined with clinical  observations, patient history, and epidemiological information. If result is POSITIVE SARS-CoV-2 target nucleic acids are DETECTED. The SARS-CoV-2 RNA is generally detectable in upper and lower  respiratory specimens dur ing the acute phase of infection.  Positive  results are indicative of active infection with SARS-CoV-2.  Clinical  correlation with patient history and other diagnostic information is  necessary to determine patient infection status.  Positive results do  not rule out bacterial infection or co-infection with other viruses. If result is PRESUMPTIVE POSTIVE SARS-CoV-2 nucleic acids MAY BE PRESENT.   A presumptive positive result was obtained on the submitted specimen  and confirmed on repeat testing.  While 2019 novel coronavirus  (SARS-CoV-2) nucleic acids may be present in the submitted sample  additional confirmatory testing may be necessary for epidemiological  and / or clinical management purposes  to differentiate between  SARS-CoV-2 and other Sarbecovirus currently known to infect humans.  If clinically indicated additional testing with an alternate test  methodology [phone]) is advised. The SARS-CoV-2 RNA is generally  detectable in upper and lower respiratory sp ecimens during the acute  phase of infection. The expected result is Negative. Fact Sheet for Patients:  Boiler[employer].cy Fact Sheet for Healthcare Providers: https://pope.com/ This test is not yet approved or cleared by the  [place] FDA and has been authorized for detection and/or diagnosis of SARS-CoV-2 by FDA under an Emergency Use Authorization (EUA).  This EUA will remain in effect (meaning this test can be used) for the duration of the COVID-19 declaration under Section 564(b)(1) of the Act, 21 U.S.C. section 360bbb-3(b)(1), unless the authorization is terminated or revoked sooner. Performed at [facility], [address], [place], [place] [address]          Radiology Studies: Nm Gi Blood Loss  Result Date: 03/30/2019 CLINICAL DATA:  Several bloody stool since Sunday. EXAM: NUCLEAR MEDICINE GASTROINTESTINAL BLEEDING SCAN TECHNIQUE: Sequential abdominal images were obtained following intravenous administration of Tc-37m labeled red blood cells. RADIOPHARMACEUTICALS:  22.8 mCi Tc-69m pertechnetate in-vitro labeled red cells. COMPARISON:  CT scan 03/30/2019 FINDINGS: No active GI bleed is demonstrated over a 2 hour period of imaging. IMPRESSION: Negative nuclear medicine GI bleeding study. Electronically Signed   By: Rudie Meyer M.D.   On: 03/30/2019 17:29   Ct Abdomen Pelvis W Contrast  Result Date: 03/30/2019 CLINICAL DATA:  78 year old male with history of bright red blood in stool for the past 3 days. No abdominal pain, nausea or vomiting. EXAM: CT ABDOMEN AND PELVIS WITH CONTRAST TECHNIQUE: Multidetector CT imaging of the abdomen and pelvis was performed using the standard protocol following bolus administration of intravenous contrast. CONTRAST:  OMNIPAQUE IOHEXOL 300 MG/ML  SOLN COMPARISON:  No priors. FINDINGS: Lower chest: Tiny left-sided Bochdalek's hernia incidentally noted. Hepatobiliary: No suspicious cystic or solid hepatic lesions. No intra or extrahepatic biliary ductal dilatation. Gallbladder is normal in appearance. Pancreas: No pancreatic mass. No pancreatic ductal dilatation. No pancreatic or peripancreatic fluid or inflammatory changes. Spleen: Unremarkable.  Adrenals/Urinary Tract: Bilateral kidneys and bilateral adrenal glands are normal in appearance. No hydroureteronephrosis. Urinary bladder partially obscured by beam hardening artifact from the patient's left hip arthroplasty, but appears normal. Stomach/Bowel: Normal appearance of the stomach. No pathologic dilatation of small bowel or colon. Numerous colonic diverticulae are noted, particularly in the region of the sigmoid colon. At this time, there are no surrounding inflammatory changes to suggest an associated acute diverticulitis. Normal appendix. 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 No lymphadenopathy noted in the abdomen or pelvis. Reproductive: Prostate gland and seminal vesicles are partially obscured by beam hardening artifact but are generally normal in appearance. Other: No significant volume of ascites.  No pneumoperitoneum. Musculoskeletal: Status post left hip arthroplasty there are no aggressive appearing lytic or blastic lesions noted in the visualized portions of the skeleton. IMPRESSION: 1. No acute findings in the abdomen or pelvis. However, there is extensive colonic diverticulosis which could account for the patient's history of hematochezia. No findings to suggest an associated acute diverticulitis at this time. 2. 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 3. Additional incidental findings, as above. Electronically Signed   By: Trudie Reed M.D.   On: 03/30/2019 11:14        Scheduled Meds: . gabapentin  300 mg Oral 5 X Daily  . pantoprazole  40 mg Oral Q0600   Continuous Infusions: . sodium chloride 75 mL/hr at 03/30/19 1316     LOS: 1 day    Time spent:35 mins. More than 50% of that time was spent in counseling and/or coordination of care.      Burnadette Pop, MD [facility] Pager [phone]  If 7PM-7AM, please contact night-coverage [email or web address] Password [email or web address] 03/31/2019, 1:32 PM

## 2019-03-31 NOTE — Progress Notes (Signed)
Matthew Collins 1:45 PM  Subjective: Patient without any further bowel movements yesterday or today and feels good without complaints and no signs of bleeding  Objective: Vital signs stable afebrile no acute distress abdomen is soft nontender hemoglobin slight drop from yesterday stable today nuclear bleeding scan negative  Assessment: Seemingly resolved diverticular bleeding  Plan: We will allow soft solids for dinner and if no signs of bleeding tomorrow after lunch can probably be discharged with close outpatient follow-up with his primary gastroenterologist Dr. Matthias Hughs and please call us this weekend if we could be of any further assistance  [facility] E  office [phone] After 5PM or if no answer call [phone]

## 2019-03-31 NOTE — Plan of Care (Signed)

## 2019-04-01 LAB — CBC WITH DIFFERENTIAL/PLATELET
Abs Immature Granulocytes: 0.02 10*3/uL (ref 0.00–0.07)
Basophils Absolute: 0 10*3/uL (ref 0.0–0.1)
Basophils Relative: 1 %
Eosinophils Absolute: 0.1 10*3/uL (ref 0.0–0.5)
Eosinophils Relative: 2 %
HCT: 24.8 % — ABNORMAL LOW (ref 39.0–52.0)
Hemoglobin: 8.3 g/dL — ABNORMAL LOW (ref 13.0–17.0)
Immature Granulocytes: 0 %
Lymphocytes Relative: 29 %
Lymphs Abs: 1.5 10*3/uL (ref 0.7–4.0)
MCH: 32.2 pg (ref 26.0–34.0)
MCHC: 33.5 g/dL (ref 30.0–36.0)
MCV: 96.1 fL (ref 80.0–100.0)
Monocytes Absolute: 0.4 10*3/uL (ref 0.1–1.0)
Monocytes Relative: 8 %
Neutro Abs: 3.1 10*3/uL (ref 1.7–7.7)
Neutrophils Relative %: 60 %
Platelets: 165 10*3/uL (ref 150–400)
RBC: 2.58 MIL/uL — ABNORMAL LOW (ref 4.22–5.81)
RDW: 12.6 % (ref 11.5–15.5)
WBC: 5.2 10*3/uL (ref 4.0–10.5)
nRBC: 0 % (ref 0.0–0.2)

## 2019-04-01 MED ORDER — FERROUS SULFATE 325 (65 FE) MG PO TABS: 325.0000 mg | ORAL_TABLET | Freq: Every day | ORAL | 0 refills | Status: AC

## 2019-04-01 NOTE — Discharge Summary (Signed)
Physician Discharge Summary  Matthew Collins UEA:[id number] DOB: 1941/06/06 DOA: 03/30/2019  PCP: Creola Cornusso, John, MD  Admit date: 03/30/2019 Discharge date: 04/01/2019  Admitted From: Home Disposition:  Home  Discharge Condition:Stable CODE STATUS:FUL Diet recommendation: Regular.Low residue diet  Brief/Interim Summary: Patient is a 78 year old male with history of hyperlipidemia, chronic anemia who presents to the emergency department with hematochezia that started about 4 days ago.  Patient was eating a lot of kale, almonds on the weekend.  Patient was also feeling dizzy and lightheaded.  He was found to have orthostatic hypotension .  He denied taking any NSAIDs or blood thinners.  No prior history of GI bleed.  CT abdomen/pelvis showed extensive chronic diverticulosis.  FOBT positive.  GI consulted. Diverticular bleed suspected.  Nuclear scan test negative.  Started on conservative management.  He did not undergo any procedures during this hospitalization.  His hemoglobin remained stable in the range of 8.  No further bleeding after admission.  He was fluid resuscitated for orthostatic hypotension. He is hemodynamically stable for discharge today.  Following problems were addressed during his hospitalization:  Acute lower GI bleed/hematochezia: Painless rectal bleeding.  He has history of diverticulosis.  CT here showed extensive chronic diverticulosis which could be the most likely the reason for hematochezia.  GI consulted.  No plan for emergent colonoscopy for now.   Hemoglobin in the range of 8.  Nuclear scan test negative for GI bleed. Continue low residue diet at home.  Follow-up with GI as an outpatient.  He follows with Dr. Matthias HughsBuccini.  Acute blood loss anemia: Associated with lower GI bleed.  Started  on iron supplementation on discharge.  Orthostatic hypotension: Most likely secondary to hypovolemia. He was hypotensive on admission.  Found to have significant orthostatic.  Fluid  resuscitated.  Continue plenty of fluids at home.  Hypercholesterolemia: On statin at home   Discharge Diagnoses:  Principal Problem:   GIB (gastrointestinal bleeding) Active Problems:   Hypercholesterolemia   Microcytic hypochromic anemia    Discharge Instructions  Discharge Instructions    Diet - low sodium heart healthy   Complete by:  As directed    Discharge instructions   Complete by:  As directed    1)Please follow up with your PCP in a week.Do a CBC test during the follow up. 2)Follow up with your gastroenterologist as an outpatient. 3)Take plenty of fluids.   Increase activity slowly   Complete by:  As directed      Allergies as of 04/01/2019   No Known Allergies     Medication List    TAKE these medications   acetaminophen 325 MG tablet Commonly known as:  TYLENOL Take 325 mg by mouth every 6 (six) hours as needed (For foot pain.).   ALPRAZolam 0.25 MG tablet Commonly known as:  XANAX Take 1 tablet (0.25 mg total) by mouth 2 (two) times daily as needed. What changed:    how much to take  when to take this  reasons to take this   ferrous sulfate 325 (65 FE) MG tablet Take 1 tablet (325 mg total) by mouth daily for 30 days.   gabapentin 300 MG capsule Commonly known as:  NEURONTIN Take 300 mg by mouth 5 (five) times daily.   OVER THE COUNTER MEDICATION Take 60 mLs by mouth daily. Isotonic OPC 3 Powder   simvastatin 40 MG tablet Commonly known as:  ZOCOR TAKE 1 TABLET (40 MG TOTAL) BY MOUTH AT BEDTIME. What changed:    how much  to take  how to take this  when to take this  additional instructions   tetrahydrozoline 0.05 % ophthalmic solution Place 1 drop into both eyes 2 (two) times daily as needed (For eye irritation.).   traMADol 50 MG tablet Commonly known as:  ULTRAM Take 1 tablet (50 mg total) by mouth at bedtime as needed and may repeat dose one time if needed for pain.      Follow-up Information    Shon Baton, MD.  Schedule an appointment as soon as possible for a visit in 1 week(s).   Specialty:  Internal Medicine Contact information: [address] [place] [address] [phone]          No Known Allergies  Consultations:  GI   Procedures/Studies: Nm Gi Blood Loss  Result Date: 03/30/2019 CLINICAL DATA:  Several bloody stool since Sunday. EXAM: NUCLEAR MEDICINE GASTROINTESTINAL BLEEDING SCAN TECHNIQUE: Sequential abdominal images were obtained following intravenous administration of Tc-71m labeled red blood cells. RADIOPHARMACEUTICALS:  22.8 mCi Tc-16m pertechnetate in-vitro labeled red cells. COMPARISON:  CT scan 03/30/2019 FINDINGS: No active GI bleed is demonstrated over a 2 hour period of imaging. IMPRESSION: Negative nuclear medicine GI bleeding study. Electronically Signed   By: Marijo Sanes M.D.   On: 03/30/2019 17:29   Ct Abdomen Pelvis W Contrast  Result Date: 03/30/2019 CLINICAL DATA:  78 year old male with history of bright red blood in stool for the past 3 days. No abdominal pain, nausea or vomiting. EXAM: CT ABDOMEN AND PELVIS WITH CONTRAST TECHNIQUE: Multidetector CT imaging of the abdomen and pelvis was performed using the standard protocol following bolus administration of intravenous contrast. CONTRAST:  132mL OMNIPAQUE IOHEXOL 300 MG/ML  SOLN COMPARISON:  No priors. FINDINGS: Lower chest: Tiny left-sided Bochdalek's hernia incidentally noted. Hepatobiliary: No suspicious cystic or solid hepatic lesions. No intra or extrahepatic biliary ductal dilatation. Gallbladder is normal in appearance. Pancreas: No pancreatic mass. No pancreatic ductal dilatation. No pancreatic or peripancreatic fluid or inflammatory changes. Spleen: Unremarkable. Adrenals/Urinary Tract: Bilateral kidneys and bilateral adrenal glands are normal in appearance. No hydroureteronephrosis. Urinary bladder partially obscured by beam hardening artifact from the patient's left hip arthroplasty, but appears normal.  Stomach/Bowel: Normal appearance of the stomach. No pathologic dilatation of small bowel or colon. Numerous colonic diverticulae are noted, particularly in the region of the sigmoid colon. At this time, there are no surrounding inflammatory changes to suggest an associated acute diverticulitis. Normal appendix. 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 No lymphadenopathy noted in the abdomen or pelvis. Reproductive: Prostate gland and seminal vesicles are partially obscured by beam hardening artifact but are generally normal in appearance. Other: No significant volume of ascites.  No pneumoperitoneum. Musculoskeletal: Status post left hip arthroplasty there are no aggressive appearing lytic or blastic lesions noted in the visualized portions of the skeleton. IMPRESSION: 1. No acute findings in the abdomen or pelvis. However, there is extensive colonic diverticulosis which could account for the patient's history of hematochezia. No findings to suggest an associated acute diverticulitis at this time. 2. Aortic atherosclerosis with aneurysmal dilatation of the right common iliac artery which measures up to 2.7 cm in diameter. 3. Additional incidental findings, as above. Electronically Signed   By: Vinnie Langton M.D.   On: 03/30/2019 11:14      Subjective: Patient seen and examined the bedside this morning.  Comfortable.  Hemodynamically stable.  Mildly orthostatic on examination.  Denies any dizziness or  lightheadedness.  Denies any abdominal pain.  No further bleeding.  Stable for discharge.  Discharge Exam: Vitals:   04/01/19 0535 04/01/19 0923  BP: 112/67 103/60  Pulse: 68 84  Resp: 17   Temp: 98.4 F (36.9 C)   SpO2: 99%    Vitals:   03/31/19 1338 03/31/19 2206 04/01/19 0535 04/01/19 0923  BP: 137/81 124/71 112/67 103/60  Pulse: 77 74 68 84  Resp: 18 19 17    Temp: 98.3 F (36.8 C)  98.1 F (36.7 C) 98.4 F (36.9 C)   TempSrc: Oral Oral Oral   SpO2: 100% 98% 99%   Weight:      Height:        General: Pt is alert, awake, not in acute distress Cardiovascular: RRR, S1/S2 +, no rubs, no gallops Respiratory: CTA bilaterally, no wheezing, no rhonchi Abdominal: Soft, NT, ND, bowel sounds + Extremities: no edema, no cyanosis    The results of significant diagnostics from this hospitalization (including imaging, microbiology, ancillary and laboratory) are listed below for reference.     Microbiology: Recent Results (from the past 240 hour(s))  SARS Coronavirus 2 (CEPHEID - Performed in [facility] hospital lab), Hosp Order     Status: None   Collection Time: 03/30/19  9:43 AM  Result Value Ref Range Status   SARS Coronavirus 2 NEGATIVE NEGATIVE Final    Comment: (NOTE) If result is NEGATIVE SARS-CoV-2 target nucleic acids are NOT DETECTED. The SARS-CoV-2 RNA is generally detectable in upper and lower  respiratory specimens during the acute phase of infection. The lowest  concentration of SARS-CoV-2 viral copies this assay can detect is 250  copies / mL. A negative result does not preclude SARS-CoV-2 infection  and should not be used as the sole basis for treatment or other  patient management decisions.  A negative result may occur with  improper specimen collection / handling, submission of specimen other  than nasopharyngeal swab, presence of viral mutation(s) within the  areas targeted by this assay, and inadequate number of viral copies  (<250 copies / mL). A negative result must be combined with clinical  observations, patient history, and epidemiological information. If result is POSITIVE SARS-CoV-2 target nucleic acids are DETECTED. The SARS-CoV-2 RNA is generally detectable in upper and lower  respiratory specimens dur ing the acute phase of infection.  Positive  results are indicative of active infection with SARS-CoV-2.  Clinical  correlation with  patient history and other diagnostic information is  necessary to determine patient infection status.  Positive results do  not rule out bacterial infection or co-infection with other viruses. If result is PRESUMPTIVE POSTIVE SARS-CoV-2 nucleic acids MAY BE PRESENT.   A presumptive positive result was obtained on the submitted specimen  and confirmed on repeat testing.  While 2019 novel coronavirus  (SARS-CoV-2) nucleic acids may be present in the submitted sample  additional confirmatory testing may be necessary for epidemiological  and / or clinical management purposes  to differentiate between  SARS-CoV-2 and other Sarbecovirus currently known to infect humans.  If clinically indicated additional testing with an alternate test  methodology [phone]([id number]) is advised. The SARS-CoV-2 RNA is generally  detectable in upper and lower respiratory sp ecimens during the acute  phase of infection. The expected result is Negative. Fact Sheet for Patients:  BoilerBrush.com.cyhttps://www.fda.gov/media/136312/download Fact Sheet for Healthcare Providers: https://pope.com/https://www.fda.gov/media/136313/download This test is not yet approved or cleared by the [place] FDA and has been authorized for detection and/or diagnosis of SARS-CoV-2 by FDA  under an Emergency Use Authorization (EUA).  This EUA will remain in effect (meaning this test can be used) for the duration of the COVID-19 declaration under Section 564(b)(1) of the Act, 21 U.S.C. section 360bbb-3(b)(1), unless the authorization is terminated or revoked sooner. Performed at [facility], [address] [place], [place] [address]      Labs: BNP (last 3 results) No results for input(s): BNP in the last 8760 hours. Basic Metabolic Panel: Recent Labs  Lab 03/30/19 0943 03/31/19 0133  NA 134* 135  K 4.0 3.8  CL 102 108  CO2 23 23  GLUCOSE 130* 90  BUN 20 13  CREATININE 0.94 0.85  CALCIUM 8.1* 7.6*   Liver Function Tests: Recent Labs   Lab 03/30/19 0943  AST 29  ALT 21  ALKPHOS 51  BILITOT 0.6  PROT 6.3*  ALBUMIN 3.8   No results for input(s): LIPASE, AMYLASE in the last 168 hours. No results for input(s): AMMONIA in the last 168 hours. CBC: Recent Labs  Lab 03/30/19 0943 03/30/19 1818 03/31/19 0133 03/31/19 0948 04/01/19 0547  WBC 9.0  --  7.8  --  5.2  NEUTROABS 7.6  --  4.8  --  3.1  HGB 10.1* 9.1* 8.4* 8.4* 8.3*  HCT 30.1* 27.6* 25.7* 25.3* 24.8*  MCV 95.6  --  97.0  --  96.1  PLT 241  --  166  --  165   Cardiac Enzymes: No results for input(s): CKTOTAL, CKMB, CKMBINDEX, TROPONINI in the last 168 hours. BNP: Invalid input(s): POCBNP CBG: No results for input(s): GLUCAP in the last 168 hours. D-Dimer No results for input(s): DDIMER in the last 72 hours. Hgb A1c No results for input(s): HGBA1C in the last 72 hours. Lipid Profile No results for input(s): CHOL, HDL, LDLCALC, TRIG, CHOLHDL, LDLDIRECT in the last 72 hours. Thyroid function studies No results for input(s): TSH, T4TOTAL, T3FREE, THYROIDAB in the last 72 hours.  Invalid input(s): FREET3 Anemia work up No results for input(s): VITAMINB12, FOLATE, FERRITIN, TIBC, IRON, RETICCTPCT in the last 72 hours. Urinalysis    Component Value Date/Time   COLORURINE YELLOW 03/29/2012 1500   APPEARANCEUR CLEAR 03/29/2012 1500   LABSPEC 1.011 03/29/2012 1500   PHURINE 7.0 03/29/2012 1500   GLUCOSEU NEGATIVE 03/29/2012 1500   HGBUR NEGATIVE 03/29/2012 1500   BILIRUBINUR NEGATIVE 03/29/2012 1500   KETONESUR NEGATIVE 03/29/2012 1500   PROTEINUR NEGATIVE 03/29/2012 1500   UROBILINOGEN 0.2 03/29/2012 1500   NITRITE NEGATIVE 03/29/2012 1500   LEUKOCYTESUR NEGATIVE 03/29/2012 1500   Sepsis Labs Invalid input(s): PROCALCITONIN,  WBC,  LACTICIDVEN Microbiology Recent Results (from the past 240 hour(s))  SARS Coronavirus 2 (CEPHEID - Performed in [facility] hospital lab), Hosp Order     Status: None   Collection Time: 03/30/19  9:43 AM  Result  Value Ref Range Status   SARS Coronavirus 2 NEGATIVE NEGATIVE Final    Comment: (NOTE) If result is NEGATIVE SARS-CoV-2 target nucleic acids are NOT DETECTED. The SARS-CoV-2 RNA is generally detectable in upper and lower  respiratory specimens during the acute phase of infection. The lowest  concentration of SARS-CoV-2 viral copies this assay can detect is 250  copies / mL. A negative result does not preclude SARS-CoV-2 infection  and should not be used as the sole basis for treatment or other  patient management decisions.  A negative result may occur with  improper specimen collection / handling, submission of specimen other  than nasopharyngeal swab, presence of viral  mutation(s) within the  areas targeted by this assay, and inadequate number of viral copies  (<250 copies / mL). A negative result must be combined with clinical  observations, patient history, and epidemiological information. If result is POSITIVE SARS-CoV-2 target nucleic acids are DETECTED. The SARS-CoV-2 RNA is generally detectable in upper and lower  respiratory specimens dur ing the acute phase of infection.  Positive  results are indicative of active infection with SARS-CoV-2.  Clinical  correlation with patient history and other diagnostic information is  necessary to determine patient infection status.  Positive results do  not rule out bacterial infection or co-infection with other viruses. If result is PRESUMPTIVE POSTIVE SARS-CoV-2 nucleic acids MAY BE PRESENT.   A presumptive positive result was obtained on the submitted specimen  and confirmed on repeat testing.  While 2019 novel coronavirus  (SARS-CoV-2) nucleic acids may be present in the submitted sample  additional confirmatory testing may be necessary for epidemiological  and / or clinical management purposes  to differentiate between  SARS-CoV-2 and other Sarbecovirus currently known to infect humans.  If clinically indicated additional testing  with an alternate test  methodology [phone]) is advised. The SARS-CoV-2 RNA is generally  detectable in upper and lower respiratory sp ecimens during the acute  phase of infection. The expected result is Negative. Fact Sheet for Patients:  BoilerBrush.com.cy Fact Sheet for Healthcare Providers: https://pope.com/ This test is not yet approved or cleared by the [place] FDA and has been authorized for detection and/or diagnosis of SARS-CoV-2 by FDA under an Emergency Use Authorization (EUA).  This EUA will remain in effect (meaning this test can be used) for the duration of the COVID-19 declaration under Section 564(b)(1) of the Act, 21 U.S.C. section 360bbb-3(b)(1), unless the authorization is terminated or revoked sooner. Performed at [facility], [address] [place], [place] [address]     Please note: You were cared for by a hospitalist during your hospital stay. Once you are discharged, your primary care physician will handle any further medical issues. Please note that NO REFILLS for any discharge medications will be authorized once you are discharged, as it is imperative that you return to your primary care physician (or establish a relationship with a primary care physician if you do not have one) for your post hospital discharge needs so that they can reassess your need for medications and monitor your lab values.    Time coordinating discharge: 40 minutes  SIGNED:   Burnadette Pop, MD  [facility] 04/01/2019, 10:16 AM Pager [phone]  If 7PM-7AM, please contact night-coverage [email or web address] Password [email or web address]

## 2019-04-01 NOTE — Progress Notes (Signed)
Went over discharge papers with patient.  All questions answered.  VSS.  Pt wheeled out via NT.

## 2019-12-07 ENCOUNTER — Ambulatory Visit: Payer: Medicare Other | Attending: Internal Medicine

## 2019-12-07 DIAGNOSIS — Z23 Encounter for immunization: Secondary | ICD-10-CM | POA: Insufficient documentation

## 2019-12-07 NOTE — Progress Notes (Signed)
   Covid-19 Vaccination Clinic  Name:  Matthew Collins    MRN: [id number] DOB: [birth year]  12/07/2019  Matthew Collins was observed post Covid-19 immunization for 15 minutes without incidence. He was provided with Vaccine Information Sheet and instruction to access the V-Safe system.   Matthew Collins was instructed to call 911 with any severe reactions post vaccine: Marland Kitchen Difficulty breathing  . Swelling of your face and throat  . A fast heartbeat  . A bad rash all over your body  . Dizziness and weakness    Immunizations Administered    Name Date Dose VIS Date Route   Pfizer COVID-19 Vaccine 12/07/2019  1:48 PM 0.3 mL 10/06/2019 Intramuscular   Manufacturer: [employer], [employer]   Lot: [id number]   NDC: [address]-4

## 2019-12-30 ENCOUNTER — Ambulatory Visit: Payer: Medicare Other | Attending: Internal Medicine

## 2019-12-30 DIAGNOSIS — Z23 Encounter for immunization: Secondary | ICD-10-CM | POA: Insufficient documentation

## 2019-12-30 NOTE — Progress Notes (Signed)
   Covid-19 Vaccination Clinic  Name:  Matthew Collins    MRN: [id number] DOB: [birth year]  12/30/2019  Matthew Collins was observed post Covid-19 immunization for 15 minutes without incident. He was provided with Vaccine Information Sheet and instruction to access the V-Safe system.   Matthew Collins was instructed to call 911 with any severe reactions post vaccine: Marland Kitchen Difficulty breathing  . Swelling of face and throat  . A fast heartbeat  . A bad rash all over body  . Dizziness and weakness   Immunizations Administered    Name Date Dose VIS Date Route   Pfizer COVID-19 Vaccine 12/30/2019  1:35 PM 0.3 mL 10/06/2019 Intramuscular   Manufacturer: [employer], [employer]   Lot: [id number]   NDC: [address]-2

## 2020-08-06 ENCOUNTER — Other Ambulatory Visit: Payer: Self-pay | Admitting: Internal Medicine

## 2020-08-06 DIAGNOSIS — I723 Aneurysm of iliac artery: Secondary | ICD-10-CM

## 2020-08-26 ENCOUNTER — Ambulatory Visit
Admission: RE | Admit: 2020-08-26 | Discharge: 2020-08-26 | Disposition: A | Discharge status: Home / Self Care | Payer: Medicare Other | Source: Ambulatory Visit | Attending: Internal Medicine | Admitting: Internal Medicine

## 2020-08-26 DIAGNOSIS — I723 Aneurysm of iliac artery: Secondary | ICD-10-CM

## 2020-08-26 MED ORDER — IOPAMIDOL (ISOVUE-370) INJECTION 76%
75.0000 mL | Freq: Once | INTRAVENOUS | Status: AC | PRN
  Administered 2020-08-26: 75 mL via INTRAVENOUS

## 2021-03-27 ENCOUNTER — Other Ambulatory Visit: Payer: Self-pay | Admitting: Internal Medicine

## 2021-03-27 DIAGNOSIS — R911 Solitary pulmonary nodule: Secondary | ICD-10-CM

## 2021-04-16 ENCOUNTER — Ambulatory Visit
Admission: RE | Admit: 2021-04-16 | Discharge: 2021-04-16 | Disposition: A | Discharge status: Home / Self Care | Payer: Medicare Other | Source: Ambulatory Visit | Attending: Internal Medicine | Admitting: Internal Medicine

## 2021-04-16 DIAGNOSIS — R911 Solitary pulmonary nodule: Secondary | ICD-10-CM

## 2021-08-05 IMAGING — CT CT CTA ABD/PEL W/CM AND/OR W/O CM
2 of 7 series · 14 of 46 positions shown, 16 images · IV contrast (iopamidol)
Comparison: CT abdomen/pelvis 03/30/2019

CLINICAL DATA: Right iliac artery aneurysm

EXAM:
CTA ABDOMEN AND PELVIS WITHOUT AND WITH CONTRAST
TECHNIQUE: Multidetector CT imaging of the abdomen and pelvis was performed
using the standard protocol during bolus administration of
intravenous contrast. Multiplanar reconstructed images and MIPs were
obtained and reviewed to evaluate the vascular anatomy.
CONTRAST:  75mL QUA1UO-9LY IOPAMIDOL (QUA1UO-9LY) INJECTION 76%

[Series 4: cta arterial 2.00 bv36 s3 axial st · axial · arterial · 0.75mm/px · z∈[+991,+1381]mm · 11 of 225 slices shown, 13 images]
[im 20/225  soft-tissue]
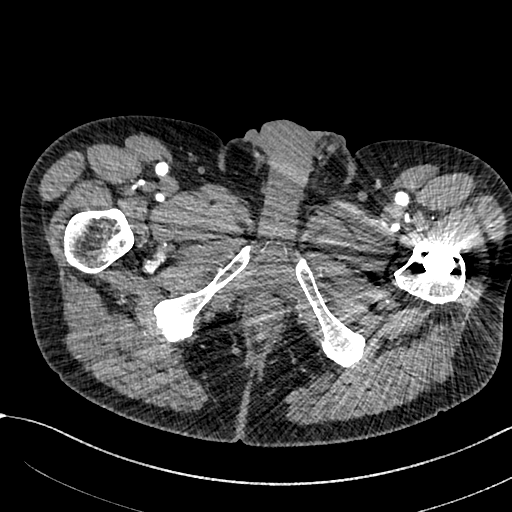
[im 20/225  bone]
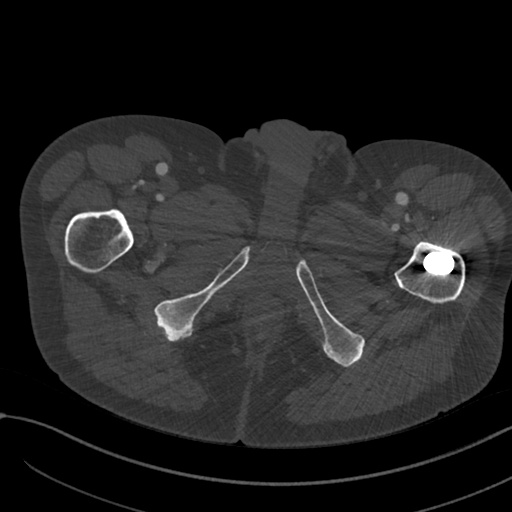
[im 39/225  soft-tissue]
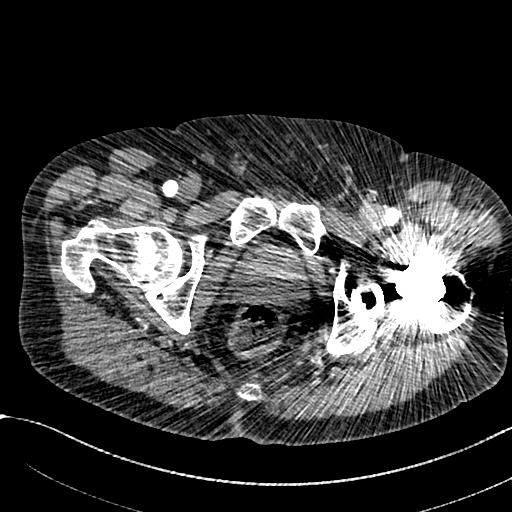
[im 59/225  soft-tissue]
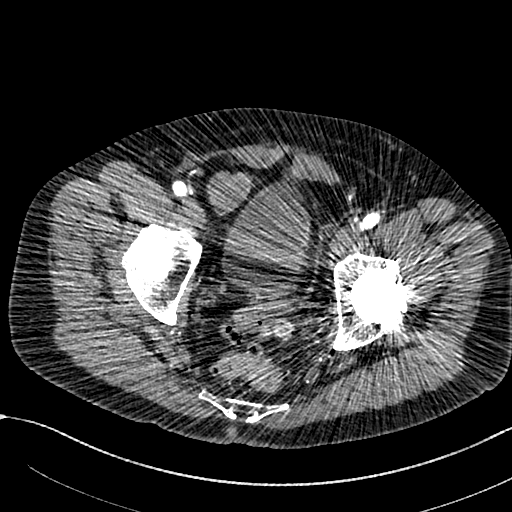
[im 78/225  soft-tissue]
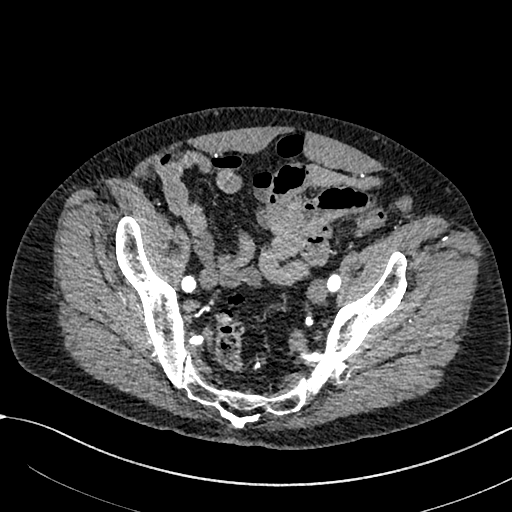
[im 98/225  soft-tissue]
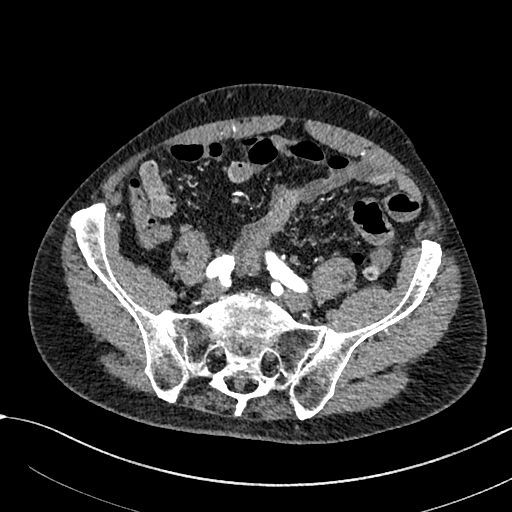
[im 117/225  soft-tissue]
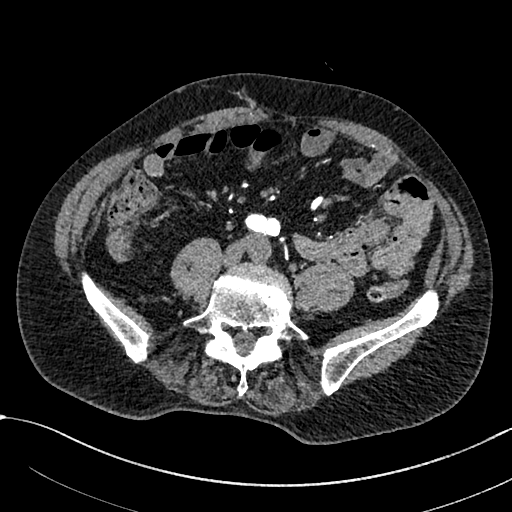
[im 137/225  soft-tissue]
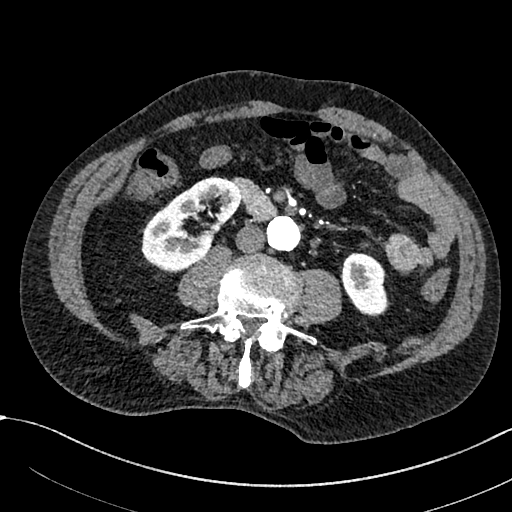
[im 156/225  soft-tissue]
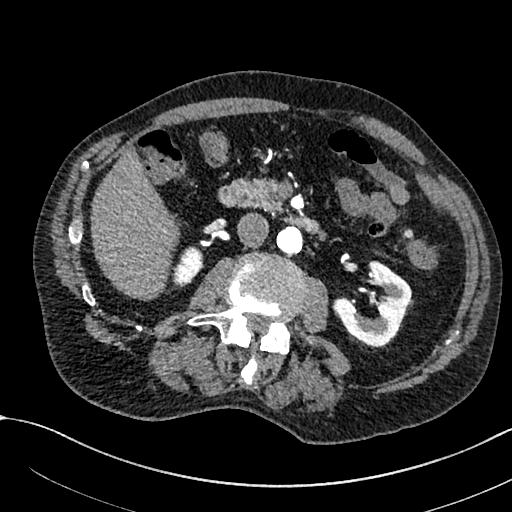
[im 176/225  soft-tissue]
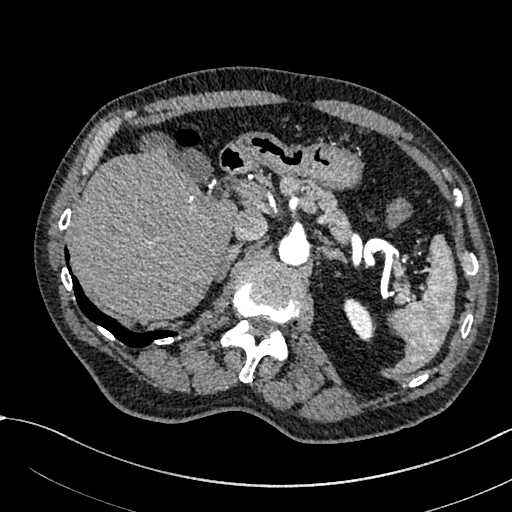
[im 176/225  bone]
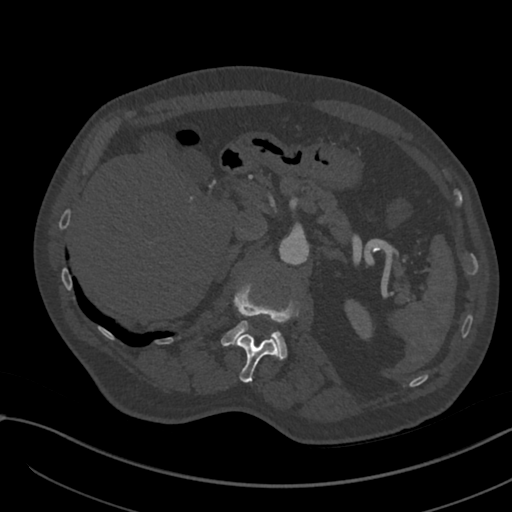
[im 195/225  soft-tissue]
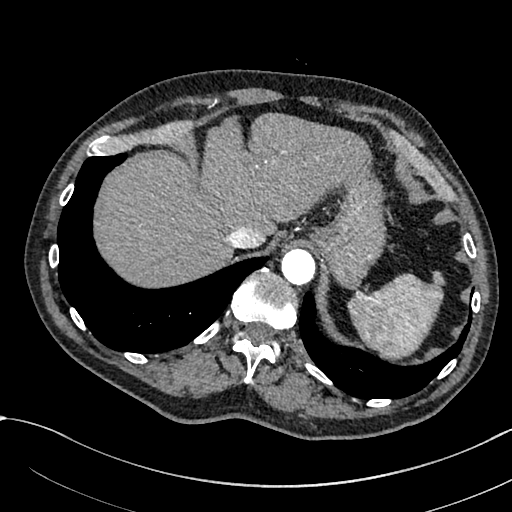
[im 215/225  soft-tissue]
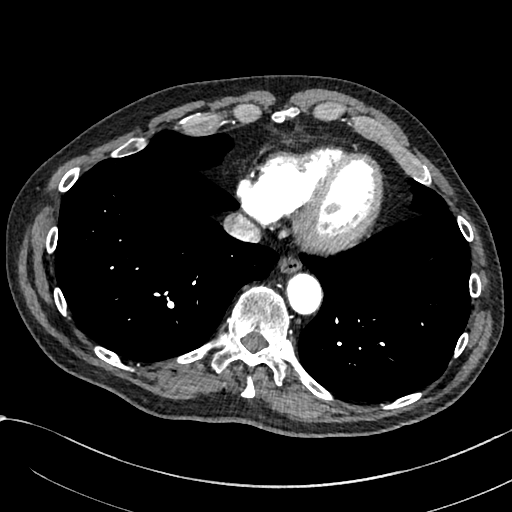

[Series 8: cta arterial 2.00 bv36 s3 cor art st · coronal · arterial · 0.70mm/px · 3 of 145 slices shown]
[im 37/145  soft-tissue]
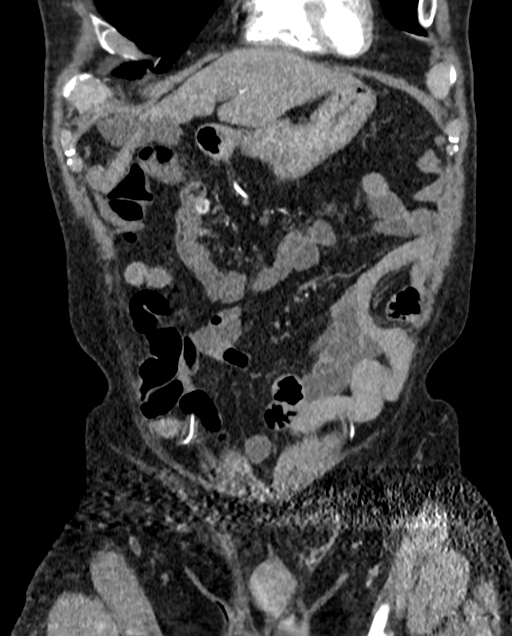
[im 73/145  soft-tissue]
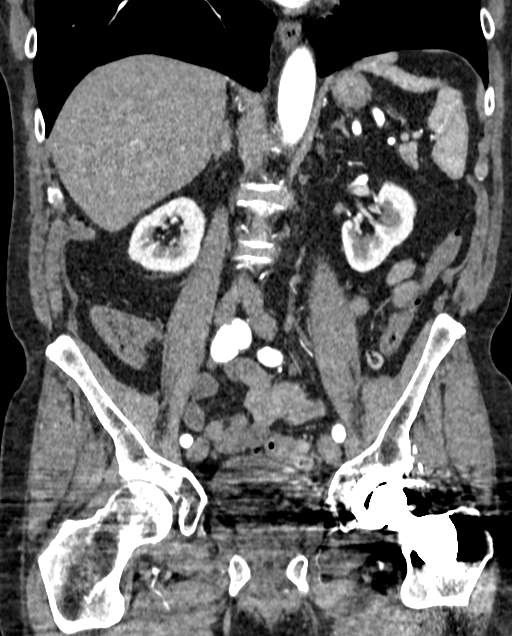
[im 109/145  soft-tissue]
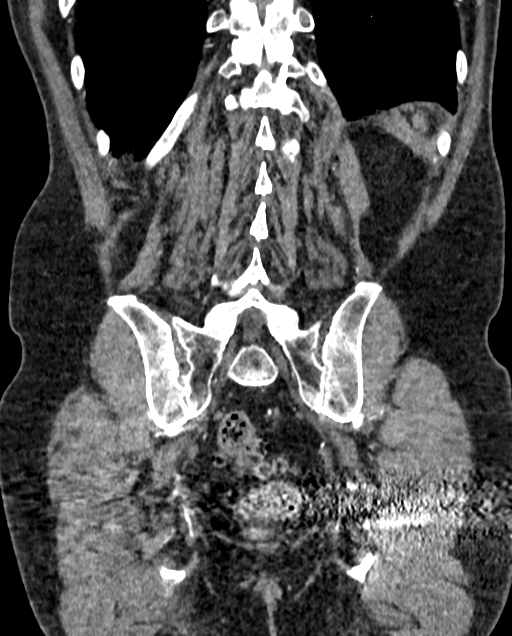

[14 of 46 positions shown; findings below may reference images not displayed]

FINDINGS: VASCULAR

Aorta: Mild heterogeneous atherosclerotic plaque along the abdominal
aorta. No evidence of aneurysm or dissection.

Celiac: Patent without evidence of aneurysm, dissection, vasculitis
or significant stenosis.

SMA: Patent without evidence of aneurysm, dissection, vasculitis or
significant stenosis.

Renals: Both renal arteries are patent without evidence of aneurysm,
dissection, vasculitis, fibromuscular dysplasia or significant
stenosis.

IMA: Patent without evidence of aneurysm, dissection, vasculitis or
significant stenosis.

Inflow: Mild aneurysmal dilation of the right common iliac artery
secondary to a focal penetrating atherosclerotic ulcer. The maximal
vessel diameter is 2.7 cm, unchanged compared to 03/30/2019.
Otherwise, mild atherosclerotic plaque bilaterally without
additional aneurysm, significant stenosis or dissection.

Proximal Outflow: Bilateral common femoral and visualized portions
of the superficial and profunda femoral arteries are patent without
evidence of aneurysm, dissection, vasculitis or significant
stenosis.

Veins: No focal venous abnormality.

Review of the MIP images confirms the above findings.

NON-VASCULAR

Lower chest: Small cluster of nodular opacities in the inferior
aspect of the right middle lobe. The largest individual nodule
measures 0.8 cm. There is a suggestion of mild peripheral
ground-glass attenuation opacity surrounding at least 1 of the
nodules. The lungs are otherwise clear.

Hepatobiliary: No focal liver abnormality is seen. No gallstones,
gallbladder wall thickening, or biliary dilatation.

Pancreas: Unremarkable. No pancreatic ductal dilatation or
surrounding inflammatory changes.

Spleen: Normal in size without focal abnormality.

Adrenals/Urinary Tract: Adrenal glands are unremarkable. Kidneys are
normal, without renal calculi, focal lesion, or hydronephrosis.
Bladder is unremarkable.

Stomach/Bowel: Stomach is within normal limits. Appendix appears
normal. No evidence of bowel wall thickening, distention, or
inflammatory changes. Colonic diverticular disease without CT
evidence of active inflammation.

Lymphatic: No suspicious lymphadenopathy.

Reproductive: Prostate is unremarkable.

Other: No abdominal wall hernia or abnormality. No abdominopelvic
ascites.

Musculoskeletal: No acute fracture or aggressive appearing lytic or
blastic osseous lesion.
IMPRESSION: VASCULAR

1. Stable focal aneurysmal dilation of the right common iliac artery
secondary to a focal penetrating atherosclerotic ulcer. The maximal
diameter remains 2.7 cm, unchanged compared to 03/30/2019.
2.  Aortic Atherosclerosis ([id number]).

NON-VASCULAR

1. Small cluster of nodular opacities in the inferior aspect of the
right middle lobe, the largest of which measures 0.8 cm. Given the
appearance, very likely infectious/inflammatory in nature.
Non-contrast chest CT at 3-6 months is recommended. If the nodules
are stable at time of repeat CT, then future CT at 18-24 months
(from today's scan) is considered optional for low-risk patients,
but is recommended for high-risk patients. This recommendation
follows the consensus statement: Guidelines for Management of
Incidental Pulmonary Nodules Detected on CT Images: From the
2. No acute abnormality within the abdomen or pelvis.
3. Colonic diverticular disease without CT evidence of active
inflammation.

## 2022-03-26 IMAGING — CT CT CHEST W/O CM
1 series · 15 of 33 positions shown, 19 images · non-contrast
Comparison: 08/26/2020 CTA abdomen pelvis

CLINICAL DATA: Inferior right middle lobe nodules measuring up to 8
mm

EXAM:
CT CHEST WITHOUT CONTRAST
TECHNIQUE: Multidetector CT imaging of the chest was performed following the
standard protocol without IV contrast.

[Series 2: chest w/(date) · axial · 0.80mm/px · z∈[-376,-46]mm · 15 of 195 slices shown, 19 images]
[im 15/195  mediastinal]
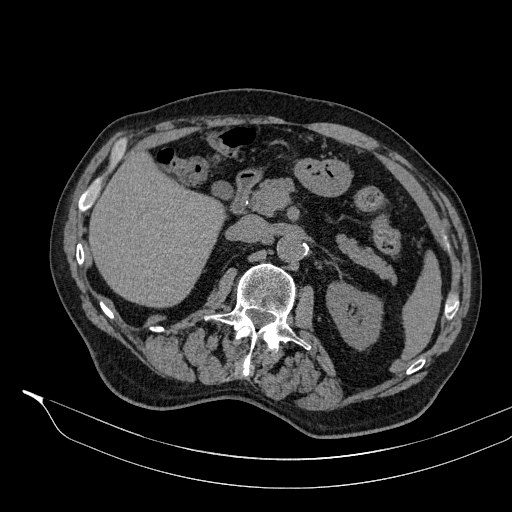
[im 15/195  lung]
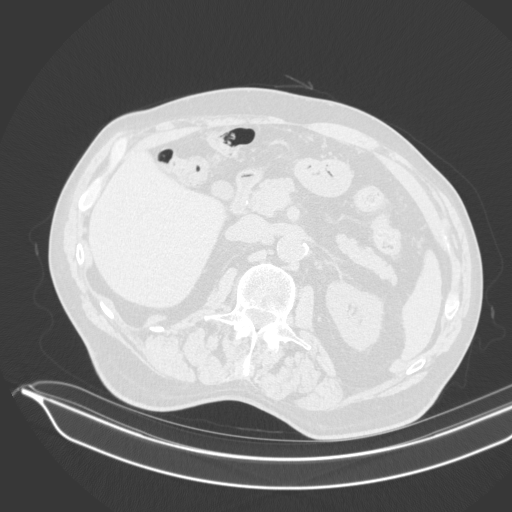
[im 29/195  lung]
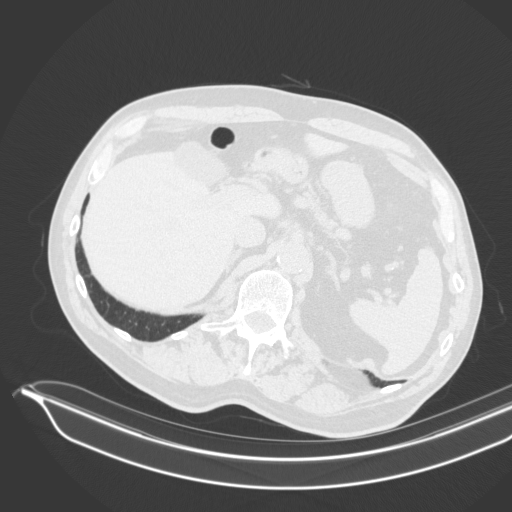
[im 39/195  lung]
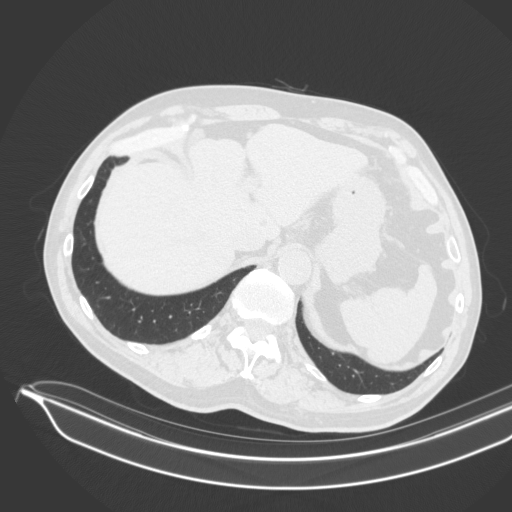
[im 51/195  lung]
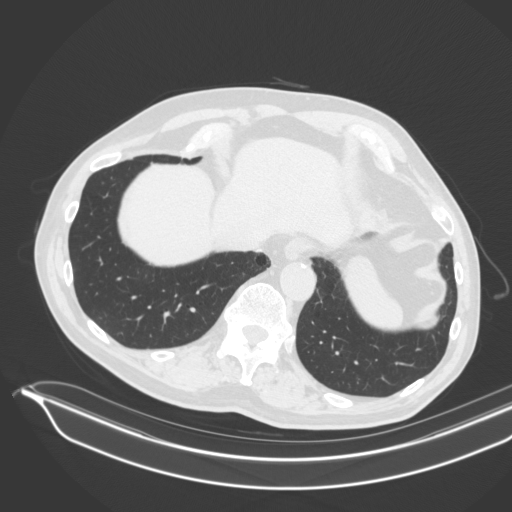
[im 65/195  mediastinal]
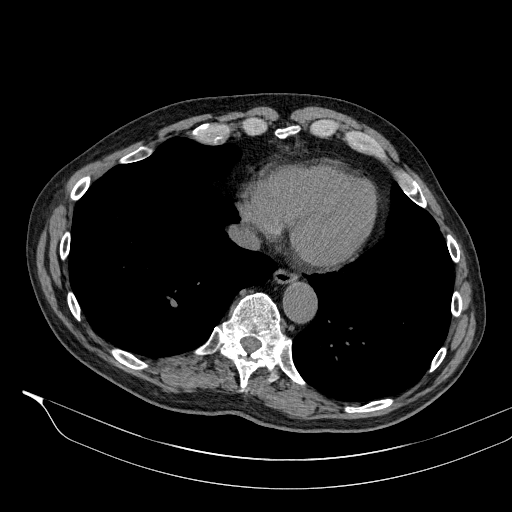
[im 65/195  lung]
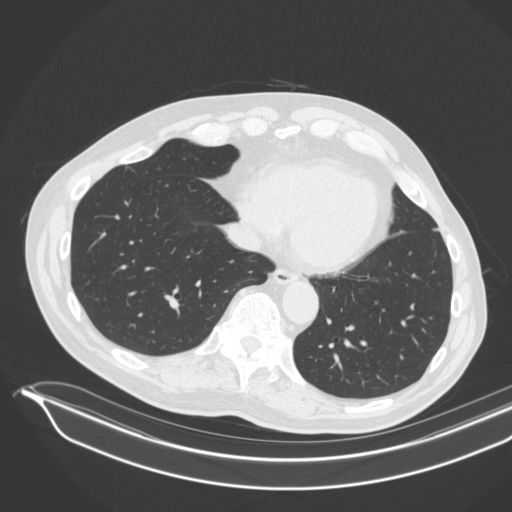
[im 78/195  lung]
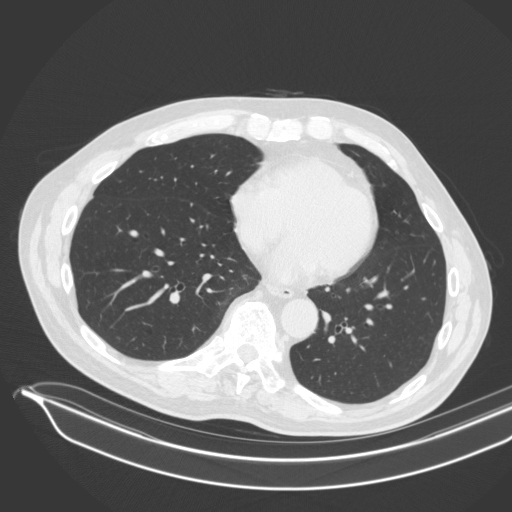
[im 87/195  lung]
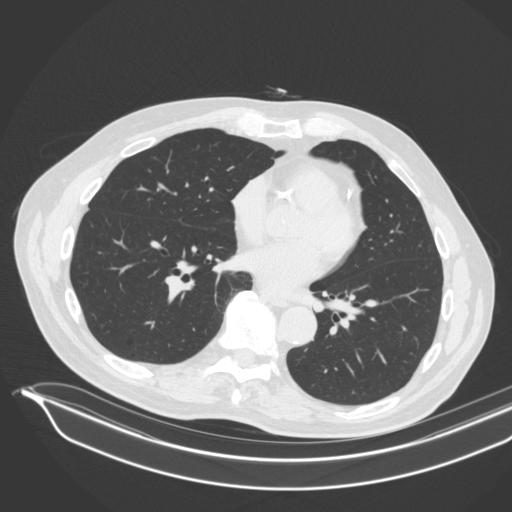
[im 101/195  lung]
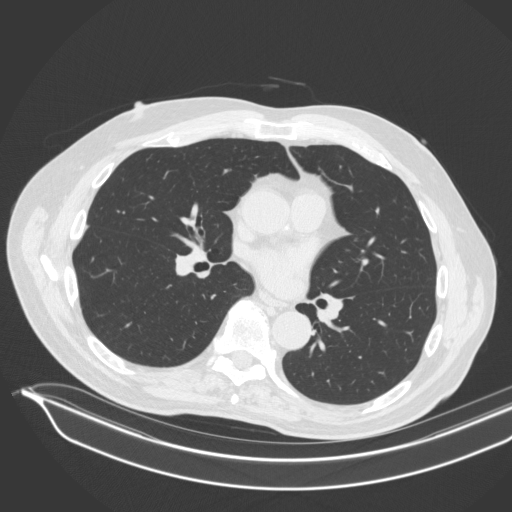
[im 108/195  mediastinal]
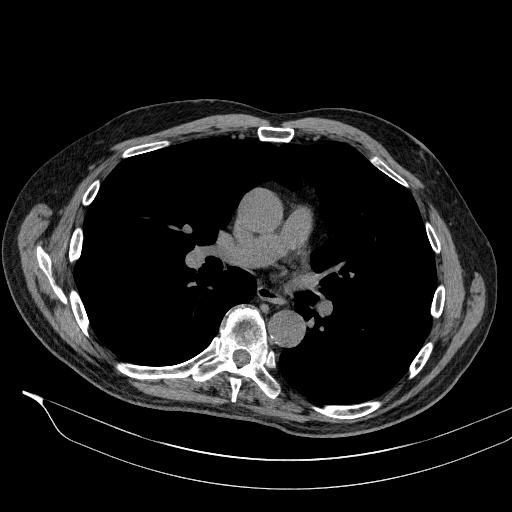
[im 108/195  lung]
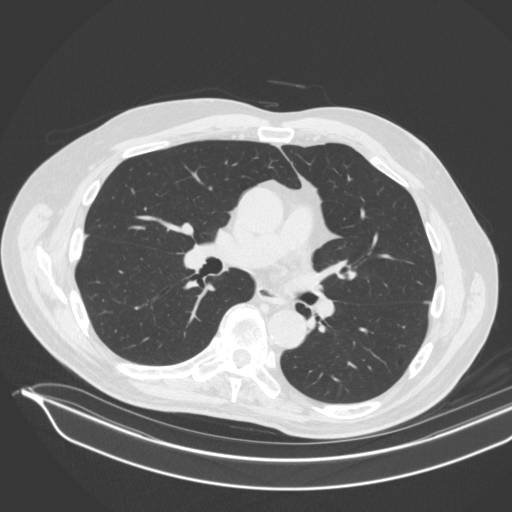
[im 117/195  lung]
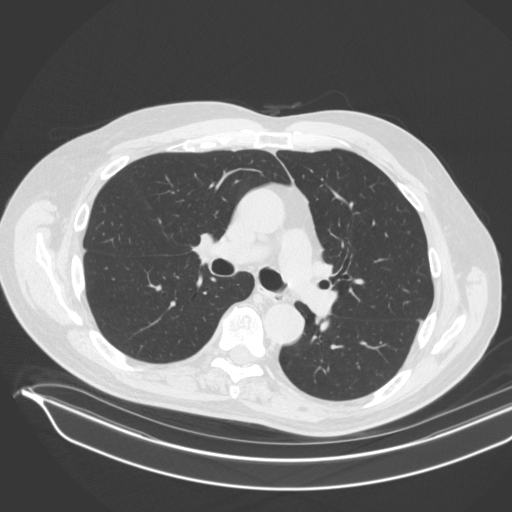
[im 130/195  lung]
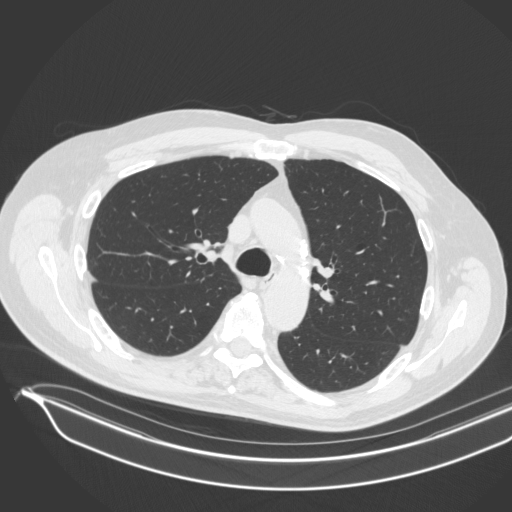
[im 144/195  lung]
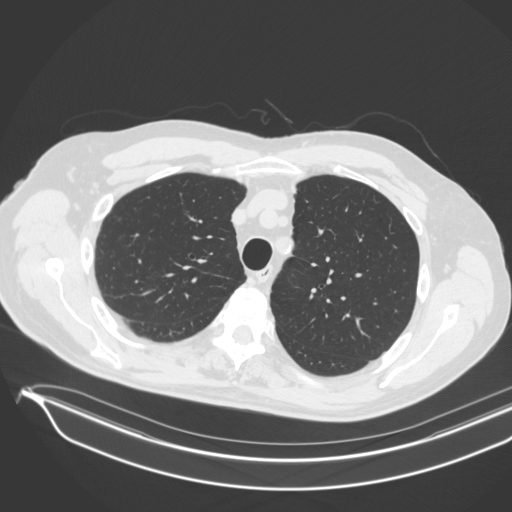
[im 156/195  mediastinal]
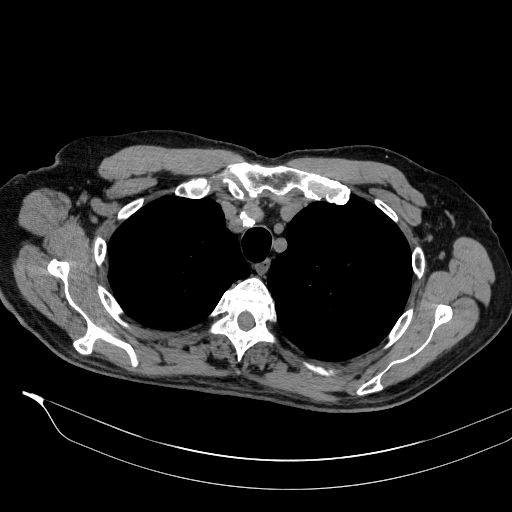
[im 156/195  lung]
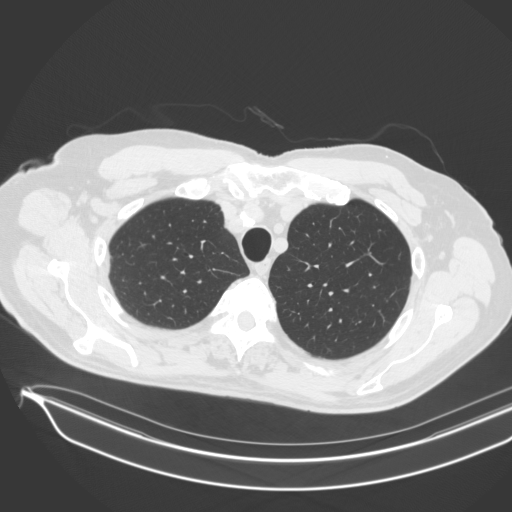
[im 166/195  lung]
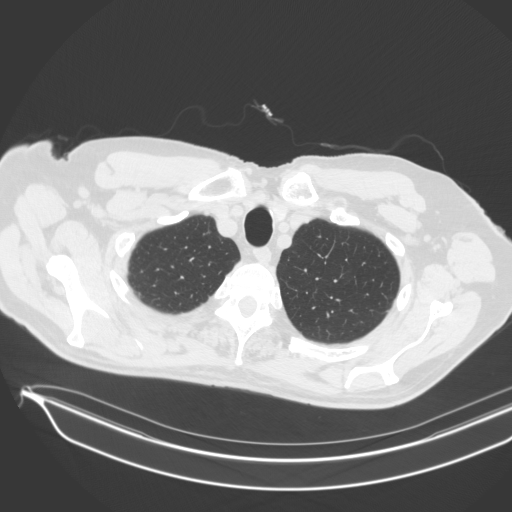
[im 180/195  lung]
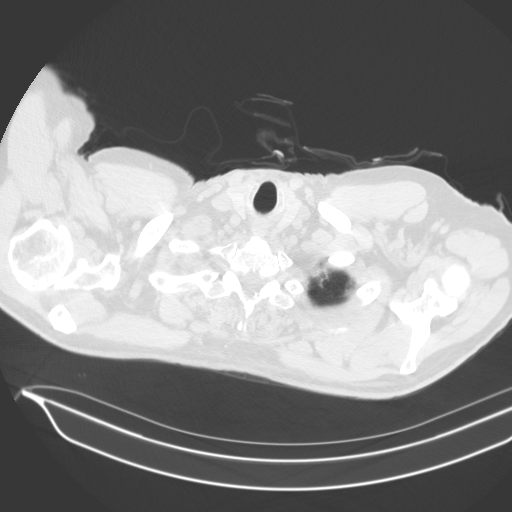

[15 of 33 positions shown; findings below may reference images not displayed]

FINDINGS: Cardiovascular: Limited without IV contrast. Aortic atherosclerosis
evident. 3 vessel arch anatomy. Negative for aneurysm. Native
coronary atherosclerosis. No mediastinal hemorrhage or hematoma.
Normal heart size. No pericardial effusion.

Mediastinum/Nodes: No enlarged mediastinal or axillary lymph nodes.
Thyroid gland, trachea, and esophagus demonstrate no significant
findings.

Lungs/Pleura: No acute airspace process, significant collapse,
consolidation. Previously described inferior right middle lobe
subpleural nodules measuring up to 8 mm have resolved. Very trace
scarring in the inferior right middle lobe. Interval resolution
favors an infectious/inflammatory etiology.

There are a few scattered bibasilar lower lobe small blebs noted. No
other suspicious pulmonary nodule or mass. No edema or interstitial
process.

No pleural abnormality, effusion, or pneumothorax.

Trachea and central airways are patent.

Upper Abdomen: No acute upper abdominal finding. No abdominal aortic
atherosclerosis noted.

Musculoskeletal: Degenerative changes noted of the spine. No acute
osseous finding.
IMPRESSION: Resolution of the subcentimeter cluster of nodules in the inferior
right middle lobe consistent with infectious/inflammatory process.

No other suspicious pulmonary nodule or acute intrathoracic finding.

Native coronary atherosclerosis

Aortic Atherosclerosis ([id number]).
